# Patient Record
Sex: Female | Born: 1960 | Race: White | Hispanic: No | Marital: Married | Smoking: Never smoker
Health system: Southern US, Community
[De-identification: ages and names within clinical notes are randomized; demographics above are authoritative.]

## PROBLEM LIST (undated history)

## (undated) DIAGNOSIS — J45909 Unspecified asthma, uncomplicated: Secondary | ICD-10-CM

## (undated) DIAGNOSIS — K635 Polyp of colon: Secondary | ICD-10-CM

## (undated) HISTORY — DX: Unspecified asthma, uncomplicated: J45.909

## (undated) HISTORY — PX: BRAIN SURGERY: SHX531

## (undated) HISTORY — DX: Polyp of colon: K63.5

---

## 1990-11-08 HISTORY — PX: LAPAROSCOPY: SHX197

## 1998-11-08 HISTORY — PX: CRANIOTOMY: SHX93

## 2004-11-25 ENCOUNTER — Ambulatory Visit: Payer: Self-pay | Admitting: Unknown Physician Specialty

## 2006-02-22 ENCOUNTER — Ambulatory Visit: Payer: Self-pay | Admitting: Unknown Physician Specialty

## 2008-09-26 ENCOUNTER — Ambulatory Visit: Payer: Self-pay | Admitting: Unknown Physician Specialty

## 2011-02-01 ENCOUNTER — Ambulatory Visit: Payer: Self-pay | Admitting: Unknown Physician Specialty

## 2011-06-20 ENCOUNTER — Emergency Department (HOSPITAL_COMMUNITY): Payer: Commercial Managed Care - PPO

## 2011-06-20 ENCOUNTER — Emergency Department (HOSPITAL_COMMUNITY)
Admission: EM | Admit: 2011-06-20 | Discharge: 2011-06-20 | Disposition: A | Discharge status: Home / Self Care | Payer: Commercial Managed Care - PPO | Attending: Emergency Medicine | Admitting: Emergency Medicine

## 2011-06-20 DIAGNOSIS — N23 Unspecified renal colic: Secondary | ICD-10-CM | POA: Insufficient documentation

## 2011-06-20 DIAGNOSIS — R109 Unspecified abdominal pain: Secondary | ICD-10-CM | POA: Insufficient documentation

## 2011-06-20 DIAGNOSIS — N200 Calculus of kidney: Secondary | ICD-10-CM | POA: Insufficient documentation

## 2011-06-20 DIAGNOSIS — R319 Hematuria, unspecified: Secondary | ICD-10-CM | POA: Insufficient documentation

## 2011-06-20 DIAGNOSIS — R1031 Right lower quadrant pain: Secondary | ICD-10-CM | POA: Insufficient documentation

## 2011-06-20 LAB — URINALYSIS, ROUTINE W REFLEX MICROSCOPIC
Bilirubin Urine: NEGATIVE
Glucose, UA: NEGATIVE mg/dL
Specific Gravity, Urine: 1.035 — ABNORMAL HIGH (ref 1.005–1.030)
pH: 5.5 (ref 5.0–8.0)

## 2011-06-20 LAB — URINE MICROSCOPIC-ADD ON

## 2011-12-13 ENCOUNTER — Ambulatory Visit: Payer: Self-pay | Admitting: Family Medicine

## 2012-01-19 ENCOUNTER — Encounter: Payer: Self-pay | Admitting: Neurology

## 2012-02-07 ENCOUNTER — Encounter: Payer: Self-pay | Admitting: Neurology

## 2012-11-08 DIAGNOSIS — K635 Polyp of colon: Secondary | ICD-10-CM

## 2012-11-08 HISTORY — DX: Polyp of colon: K63.5

## 2013-04-25 ENCOUNTER — Encounter: Payer: Self-pay | Admitting: General Surgery

## 2013-04-25 ENCOUNTER — Ambulatory Visit (INDEPENDENT_AMBULATORY_CARE_PROVIDER_SITE_OTHER): Payer: Managed Care, Other (non HMO) | Admitting: General Surgery

## 2013-04-25 VITALS — BP 110/68 | HR 72 | Resp 12 | Ht 63.0 in | Wt 132.0 lb

## 2013-04-25 DIAGNOSIS — Z1211 Encounter for screening for malignant neoplasm of colon: Secondary | ICD-10-CM

## 2013-04-25 MED ORDER — POLYETHYLENE GLYCOL 3350 17 GM/SCOOP PO POWD: ORAL | Status: DC

## 2013-04-25 NOTE — Patient Instructions (Addendum)
Colonoscopy A colonoscopy is an exam to evaluate your entire colon. In this exam, your colon is cleansed. A long fiberoptic tube is inserted through your rectum and into your colon. The fiberoptic scope (endoscope) is a long bundle of enclosed and very flexible fibers. These fibers transmit light to the area examined and send images from that area to your caregiver. Discomfort is usually minimal. You may be given a drug to help you sleep (sedative) during or prior to the procedure. This exam helps to detect lumps (tumors), polyps, inflammation, and areas of bleeding. Your caregiver may also take a small piece of tissue (biopsy) that will be examined under a microscope. LET YOUR CAREGIVER KNOW ABOUT:   Allergies to food or medicine.  Medicines taken, including vitamins, herbs, eyedrops, over-the-counter medicines, and creams.  Use of steroids (by mouth or creams).  Previous problems with anesthetics or numbing medicines.  History of bleeding problems or blood clots.  Previous surgery.  Other health problems, including diabetes and kidney problems.  Possibility of pregnancy, if this applies. BEFORE THE PROCEDURE   A clear liquid diet may be required for 2 days before the exam.  Ask your caregiver about changing or stopping your regular medications.  Liquid injections (enemas) or laxatives may be required.  A large amount of electrolyte solution may be given to you to drink over a short period of time. This solution is used to clean out your colon.  You should be present 60 minutes prior to your procedure or as directed by your caregiver. AFTER THE PROCEDURE   If you received a sedative or pain relieving medication, you will need to arrange for someone to drive you home.  Occasionally, there is a little blood passed with the first bowel movement. Do not be concerned. FINDING OUT THE RESULTS OF YOUR TEST Not all test results are available during your visit. If your test results are  not back during the visit, make an appointment with your caregiver to find out the results. Do not assume everything is normal if you have not heard from your caregiver or the medical facility. It is important for you to follow up on all of your test results. HOME CARE INSTRUCTIONS   It is not unusual to pass moderate amounts of gas and experience mild abdominal cramping following the procedure. This is due to air being used to inflate your colon during the exam. Walking or a warm pack on your belly (abdomen) may help.  You may resume all normal meals and activities after sedatives and medicines have worn off.  Only take over-the-counter or prescription medicines for pain, discomfort, or fever as directed by your caregiver. Do not use aspirin or blood thinners if a biopsy was taken. Consult your caregiver for medicine usage if biopsies were taken. SEEK IMMEDIATE MEDICAL CARE IF:   You have a fever.  You pass large blood clots or fill a toilet with blood following the procedure. This may also occur 10 to 14 days following the procedure. This is more likely if a biopsy was taken.  You develop abdominal pain that keeps getting worse and cannot be relieved with medicine. Document Released: 10/22/2000 Document Revised: 01/17/2012 Document Reviewed: 06/06/2008 [facility] Patient Information 2014 [place], [place].  Patient has been scheduled for a colonoscopy on 06-27-13 at [facility]

## 2013-04-25 NOTE — Progress Notes (Signed)
Patient ID: Christina Neal, female   DOB: 07-23-1961, 52 y.o.   MRN: [id number]  Chief Complaint  Patient presents with  . Other    colonoscopy    HPI Christina Neal is a 52 y.o. female here today for an screening colonoscopy. Patient states she has not none pior. No family history of colon caner.   HPI  History reviewed. No pertinent past medical history.  Past Surgical History  Procedure Laterality Date  . Laparoscopy  1992  . Craniotomy  2000    No family history on file.  Social History History  Substance Use Topics  . Smoking status: Never Smoker   . Smokeless tobacco: Never Used  . Alcohol Use: Yes    No Known Allergies  Current Outpatient Prescriptions  Medication Sig Dispense Refill  . MIMVEY 1-0.5 MG per tablet Take 1 tablet by mouth daily.      . polyethylene glycol powder (GLYCOLAX/MIRALAX) powder 255 grams one bottle for colonoscopy prep  255 g  0   No current facility-administered medications for this visit.    Review of Systems Review of Systems  Constitutional: Negative.   Respiratory: Negative.   Cardiovascular: Negative.     Blood pressure 110/68, pulse 72, resp. rate 12, height 5\' 3"  (1.6 m), weight 132 lb (59.875 kg).  Physical Exam Physical Exam  Constitutional: She appears well-developed and well-nourished.  Cardiovascular: Normal rate, regular rhythm and normal heart sounds.   Pulmonary/Chest: Breath sounds normal.  Lymphadenopathy:    She has no cervical adenopathy.    Data Reviewed No data to review.  Assessment    No data for review.candidate for screening colonoscopy.     Plan    The procedure as been reviewed in detail with the patient she is amenable to proceed.      Patient has been scheduled for a colonoscopy on 06-27-13 at [facility].  Christina Neal 04/26/2013, 8:49 PM

## 2013-04-26 ENCOUNTER — Encounter: Payer: Self-pay | Admitting: General Surgery

## 2013-04-26 DIAGNOSIS — Z1211 Encounter for screening for malignant neoplasm of colon: Secondary | ICD-10-CM | POA: Insufficient documentation

## 2013-06-20 ENCOUNTER — Telehealth: Payer: Self-pay | Admitting: *Deleted

## 2013-06-20 NOTE — Telephone Encounter (Signed)
Patient reports no medication changes since last office visit. We will proceed with colonoscopy that is scheduled for 06-27-13 at [facility]. This patient reports that she has not yet pre-registered but was instructed to do so before Friday. She will call the office if she has further questions.

## 2013-06-25 ENCOUNTER — Other Ambulatory Visit: Payer: Self-pay | Admitting: General Surgery

## 2013-06-25 DIAGNOSIS — Z1211 Encounter for screening for malignant neoplasm of colon: Secondary | ICD-10-CM

## 2013-06-27 ENCOUNTER — Ambulatory Visit: Payer: Self-pay | Admitting: General Surgery

## 2013-06-27 DIAGNOSIS — D128 Benign neoplasm of rectum: Secondary | ICD-10-CM

## 2013-06-27 DIAGNOSIS — D129 Benign neoplasm of anus and anal canal: Secondary | ICD-10-CM

## 2013-06-28 LAB — PATHOLOGY REPORT

## 2013-06-29 ENCOUNTER — Encounter: Payer: Self-pay | Admitting: General Surgery

## 2013-06-29 ENCOUNTER — Telehealth: Payer: Self-pay | Admitting: General Surgery

## 2013-06-29 NOTE — Telephone Encounter (Signed)
The patient was notified that the polyp removed from the splenic flexure was benign, but does put her at risk for future polyps. She's been encouraged to have a followup exam in 5 years.  She reports that she's done well since the procedure was completed.

## 2013-07-04 ENCOUNTER — Encounter: Payer: Self-pay | Admitting: General Surgery

## 2013-09-13 ENCOUNTER — Other Ambulatory Visit: Payer: Self-pay

## 2014-09-09 ENCOUNTER — Encounter: Payer: Self-pay | Admitting: General Surgery

## 2016-09-24 LAB — HM PAP SMEAR: HM Pap smear: NEGATIVE

## 2017-05-03 ENCOUNTER — Other Ambulatory Visit: Payer: Self-pay | Admitting: Certified Nurse Midwife

## 2017-05-03 ENCOUNTER — Telehealth: Payer: Self-pay

## 2017-05-03 DIAGNOSIS — Z1239 Encounter for other screening for malignant neoplasm of breast: Secondary | ICD-10-CM

## 2017-05-03 NOTE — Telephone Encounter (Signed)
Pt lost her mammogram rx [facility] gave to for [place].  Can another rx be sent? [phone]

## 2017-05-03 NOTE — Telephone Encounter (Signed)
Pleased order in [place]. Called patient and advised to call [facility] for appointment

## 2017-05-05 ENCOUNTER — Inpatient Hospital Stay: Admission: RE | Admit: 2017-05-05 | Payer: Commercial Managed Care - PPO | Source: Ambulatory Visit

## 2017-05-20 ENCOUNTER — Ambulatory Visit
Admission: RE | Admit: 2017-05-20 | Discharge: 2017-05-20 | Disposition: A | Discharge status: Home / Self Care | Payer: Commercial Managed Care - PPO | Source: Ambulatory Visit | Attending: Certified Nurse Midwife | Admitting: Certified Nurse Midwife

## 2017-05-20 DIAGNOSIS — Z1231 Encounter for screening mammogram for malignant neoplasm of breast: Secondary | ICD-10-CM | POA: Insufficient documentation

## 2017-05-20 DIAGNOSIS — Z1239 Encounter for other screening for malignant neoplasm of breast: Secondary | ICD-10-CM

## 2017-10-14 ENCOUNTER — Telehealth: Payer: Self-pay

## 2017-10-14 MED ORDER — ESTRADIOL-LEVONORGESTREL 0.045-0.015 MG/DAY TD PTWK
1.0000 | MEDICATED_PATCH | TRANSDERMAL | 0 refills | Status: DC
Start: 2017-10-14 — End: 2018-03-16

## 2017-10-14 NOTE — Telephone Encounter (Signed)
Pt has annual sched, needs refill of climara pro.  Rx eRx'd.

## 2017-10-20 ENCOUNTER — Encounter: Payer: Self-pay | Admitting: Advanced Practice Midwife

## 2017-10-20 ENCOUNTER — Ambulatory Visit (INDEPENDENT_AMBULATORY_CARE_PROVIDER_SITE_OTHER): Payer: 59 | Admitting: Advanced Practice Midwife

## 2017-10-20 VITALS — BP 108/76 | HR 63 | Ht 62.5 in | Wt 144.0 lb

## 2017-10-20 DIAGNOSIS — N898 Other specified noninflammatory disorders of vagina: Secondary | ICD-10-CM

## 2017-10-20 DIAGNOSIS — Z01419 Encounter for gynecological examination (general) (routine) without abnormal findings: Secondary | ICD-10-CM | POA: Diagnosis not present

## 2017-10-20 DIAGNOSIS — N941 Unspecified dyspareunia: Secondary | ICD-10-CM

## 2017-10-20 MED ORDER — PRASTERONE 6.5 MG VA INST: 6.5000 mg | VAGINAL_INSERT | Freq: Every day | VAGINAL | 11 refills | Status: DC

## 2017-10-20 NOTE — Progress Notes (Signed)
Patient ID: Christina Neal, female   DOB: 1961-07-20, 56 y.o.   MRN: [id number]     Gynecology Annual Exam  PCP: Albina Billet, MD  Chief Complaint:  Chief Complaint  Patient presents with  . Gynecologic Exam    vaginal itching x few days  . Vaginitis    History of Present Illness:Patient is a 56 y.o. G0P0 presents for annual exam. The patient has complaint today of vaginal itching that she has noticed for some number of months although it has intensified in the last few days. She denies any other symptoms of discharge or irritation. She tried OTC Monistat for treatment and has noticed a small improvement in symptoms. She has been using HRT patch for a number of years for postmenopausal symptoms of vaginal dryness and hot flashes. Discussion of other options for HRT. She agrees to try Intrarosa instead of patch. She does admit a new sex partner recently. She understands the potential risk of HPV and STDs but declines PAP and STD testing today. She will have that done at her next annual visit.  LMP: No LMP recorded. Patient is postmenopausal.  Postcoital Bleeding: no Dysmenorrhea: not applicable   The patient is sexually active. She admits to dyspareunia.  The patient does not perform self breast exams.  There is no notable family history of breast or ovarian cancer in her family.  The patient wears seatbelts: yes.   The patient has regular exercise: yes.  She has an active lifestyle which includes strength, flexibility and cardio aspects of exercise. She admits to a healthy diet and adequate hydration with h2o. She admits to drinking 2 alcoholic beverages per day and attributes her lack of ever being sick to the antiseptic nature of the alcohol. She is aware that more than 1-2 drinks per day is not recommended for women.   The patient denies current symptoms of depression.     Review of Systems: Review of Systems  Constitutional: Negative.   HENT: Negative.   Eyes: Negative.     Respiratory: Negative.   Cardiovascular: Negative.   Gastrointestinal: Negative.   Genitourinary: Negative.   Musculoskeletal: Negative.   Skin: Negative.   Neurological: Negative.   Endo/Heme/Allergies: Negative.   Psychiatric/Behavioral: Negative.     Past Medical History:  History reviewed. No pertinent past medical history.  Past Surgical History:  Past Surgical History:  Procedure Laterality Date  . CRANIOTOMY  2000  . LAPAROSCOPY  1992    Gynecologic History:  No LMP recorded. Patient is postmenopausal. Last Pap: 1 year ago Results were:  no abnormalities  Last mammogram: 1 year ago Results were: BI-RAD I Obstetric History: G0P0  Family History:  Family History  Problem Relation Age of Onset  . Breast cancer Cousin     Social History:  Social History   Socioeconomic History  . Marital status: Married    Spouse name: Not on file  . Number of children: Not on file  . Years of education: Not on file  . Highest education level: Not on file  Social Needs  . Financial resource strain: Not on file  . Food insecurity - worry: Not on file  . Food insecurity - inability: Not on file  . Transportation needs - medical: Not on file  . Transportation needs - non-medical: Not on file  Occupational History  . Not on file  Tobacco Use  . Smoking status: Never Smoker  . Smokeless tobacco: Never Used  Substance and Sexual Activity  .  Alcohol use: Yes  . Drug use: No  . Sexual activity: Yes    Birth control/protection: None, Post-menopausal  Other Topics Concern  . Not on file  Social History Narrative  . Not on file    Allergies:  No Known Allergies  Medications: Prior to Admission medications   Medication Sig Start Date End Date Taking? Authorizing Provider  estradiol-levonorgestrel [facility]) 0.045-0.015 MG/DAY Place 1 patch onto the skin once a week. 10/14/17   Homero Fellers, MD    Physical Exam Vitals: Blood pressure 108/76, pulse 63, height  5' 2.5" (1.588 m), weight 144 lb (65.3 kg).  General: NAD HEENT: normocephalic, anicteric Thyroid: no enlargement, no palpable nodules Pulmonary: No increased work of breathing, CTAB Cardiovascular: RRR, distal pulses 2+ Breast: Breast symmetrical, no tenderness, no palpable nodules or masses, no skin or nipple retraction present, no nipple discharge.  No axillary or supraclavicular lymphadenopathy. Abdomen: NABS, soft, non-tender, non-distended.  Umbilicus without lesions.  No hepatomegaly, splenomegaly or masses palpable. No evidence of hernia  Genitourinary:  External: Normal external female genitalia.  Normal urethral meatus, normal  Bartholin's and Skene's glands.    Vagina: Normal vaginal mucosa, no evidence of prolapse.  Remnants of Monistat in vault  Cervix: Grossly normal in appearance, no bleeding, no CMT  Uterus: Non-enlarged, mobile, normal contour.    Adnexa: ovaries non-enlarged, no adnexal masses  Rectal: deferred  Lymphatic: no evidence of inguinal lymphadenopathy Extremities: no edema, erythema, or tenderness Neurologic: Grossly intact Psychiatric: mood appropriate, affect full  Wet Prep: negative for yeast, whiff, clue cells    Assessment: 56 y.o. G0P0 routine annual exam  Plan: Problem List Items Addressed This Visit    None    Visit Diagnoses    Well woman exam with routine gynecological exam    -  Primary   Relevant Medications   Prasterone (INTRAROSA) 6.5 MG INST   Other Relevant Orders   MM DIGITAL SCREENING BILATERAL   Dyspareunia in female       Relevant Medications   Prasterone (INTRAROSA) 6.5 MG INST   Itching in the vaginal area       Relevant Medications   Prasterone (INTRAROSA) 6.5 MG INST      1) Mammogram - recommend yearly screening mammogram.  Mammogram Was ordered today  2) STI screening was offered and declined  3) ASCCP guidelines and rational discussed.  Patient opts for every 3 years screening interval  4) Osteoporosis  - per  USPTF routine screening DEXA at age 85   Consider FDA-approved medical therapies in postmenopausal women and men aged 40 years and older, based on the following: a) A hip or vertebral (clinical or morphometric) fracture b) T-score ? -2.5 at the femoral neck or spine after appropriate evaluation to exclude secondary causes C) Low bone mass (T-score between -1.0 and -2.5 at the femoral neck or spine) and a 10-year probability of a hip fracture ? 3% or a 10-year probability of a major osteoporosis-related fracture ? 20% based on the [place]-adapted WHO algorithm   5) Routine healthcare maintenance including cholesterol, diabetes screening discussed managed by PCP  6) Colonoscopy: last screening was 3 years ago.  Screening recommended starting at age 25 for average risk individuals, age 63 for individuals deemed at increased risk (including African Americans) and recommended to continue until age 64.  For patient age 70-85 individualized approach is recommended.  Gold standard screening is via colonoscopy, Cologuard screening is an acceptable alternative for patient unwilling or unable to undergo colonoscopy.  "  Colorectal cancer screening for average?risk adults: 2018 guideline update from the American Cancer Society"CA: A Cancer Journal for Clinicians: Apr 06, 2017   7) Rx for Intrarosa to replace hormone patch  8) Follow up 1 year for routine annual  Rod Can, [place]

## 2017-10-24 ENCOUNTER — Ambulatory Visit: Payer: Managed Care, Other (non HMO) | Admitting: Obstetrics and Gynecology

## 2018-03-14 ENCOUNTER — Telehealth: Payer: Self-pay

## 2018-03-14 NOTE — Telephone Encounter (Signed)
Pt was seen the end of 2018, HRT was changed from [place] patch to [place].  She really like the Intrarosa.  She is wondering if she can do both d/t soo many hot flashes?  [phone]

## 2018-03-16 ENCOUNTER — Other Ambulatory Visit: Payer: Self-pay | Admitting: Advanced Practice Midwife

## 2018-03-16 DIAGNOSIS — R232 Flushing: Secondary | ICD-10-CM

## 2018-03-16 MED ORDER — ESTRADIOL-LEVONORGESTREL 0.045-0.015 MG/DAY TD PTWK: 1.0000 | MEDICATED_PATCH | TRANSDERMAL | 11 refills | Status: DC

## 2018-03-16 NOTE — Telephone Encounter (Signed)
Pt inquiring about previous call from 03/14/18 as she hasn't heard anything.

## 2018-03-16 NOTE — Telephone Encounter (Signed)
Spoke with patient and sent Rx for Climarapro for relief of hot flashes

## 2018-03-16 NOTE — Progress Notes (Signed)
Rx sent Climarapro for relief of hot flashes

## 2018-10-10 ENCOUNTER — Ambulatory Visit: Payer: 59 | Admitting: General Surgery

## 2018-10-23 ENCOUNTER — Other Ambulatory Visit: Payer: Self-pay | Admitting: Advanced Practice Midwife

## 2018-10-23 DIAGNOSIS — N941 Unspecified dyspareunia: Secondary | ICD-10-CM

## 2018-10-23 DIAGNOSIS — Z01419 Encounter for gynecological examination (general) (routine) without abnormal findings: Secondary | ICD-10-CM

## 2018-10-23 DIAGNOSIS — R232 Flushing: Secondary | ICD-10-CM

## 2018-10-23 DIAGNOSIS — N898 Other specified noninflammatory disorders of vagina: Secondary | ICD-10-CM

## 2018-11-20 ENCOUNTER — Telehealth: Payer: Self-pay | Admitting: General Surgery

## 2018-11-20 NOTE — Telephone Encounter (Signed)
Spoke with the patients letting her know we would need to r/s her appointment with Dr. Bary Castilla on 11/28/2018. Patient stated she did not want to see another provider and would like to see Dr. Bary Castilla I did let the patient know we were not sure of a return date as of right now and would call her back to let her know.

## 2018-11-28 ENCOUNTER — Ambulatory Visit: Payer: Self-pay | Admitting: General Surgery

## 2018-12-06 NOTE — Telephone Encounter (Signed)
Patients coming in on 01/11/2019 to see dr. Bary Castilla

## 2019-01-11 ENCOUNTER — Ambulatory Visit (INDEPENDENT_AMBULATORY_CARE_PROVIDER_SITE_OTHER): Payer: BLUE CROSS/BLUE SHIELD | Admitting: General Surgery

## 2019-01-11 ENCOUNTER — Other Ambulatory Visit: Payer: Self-pay

## 2019-01-11 ENCOUNTER — Encounter: Payer: Self-pay | Admitting: General Surgery

## 2019-01-11 VITALS — BP 120/86 | HR 74 | Temp 98.1°F | Resp 16 | Ht 62.0 in | Wt 141.8 lb

## 2019-01-11 DIAGNOSIS — Z86010 Personal history of colonic polyps: Secondary | ICD-10-CM | POA: Diagnosis not present

## 2019-01-11 MED ORDER — METOCLOPRAMIDE HCL 5 MG PO TABS: 5.0000 mg | ORAL_TABLET | ORAL | 0 refills | Status: DC

## 2019-01-11 MED ORDER — POLYETHYLENE GLYCOL 3350 17 GM/SCOOP PO POWD: ORAL | 0 refills | Status: DC

## 2019-01-11 NOTE — Progress Notes (Signed)
Patient ID: Christina Neal, female   DOB: 04/19/1961, 58 y.o.   MRN: [id number]  Chief Complaint  Patient presents with  . Follow-up     5 year followup colonoscopy    HPI Christina Neal is a 58 y.o. female here today to discuss having a follow up for colonoscopy, last completed 2014. Patient states she is feeling well, denies any GI issues. She is here today with her husband, Christina Neal.   HPI  Past Medical History:  Diagnosis Date  . Colon polyp 2014    Past Surgical History:  Procedure Laterality Date  . CRANIOTOMY  2000  . LAPAROSCOPY  1992    Family History  Problem Relation Age of Onset  . Breast cancer Cousin     Social History Social History   Tobacco Use  . Smoking status: Never Smoker  . Smokeless tobacco: Never Used  Substance Use Topics  . Alcohol use: Yes  . Drug use: No    No Known Allergies  Current Outpatient Medications  Medication Sig Dispense Refill  . CLIMARA PRO 0.045-0.015 MG/DAY APPLY 1 PATCH ONCE A WEEK 12 patch 4  . INTRAROSA 6.5 MG INST PLACE 6.5 MG VAGINALLY AT BEDTIME. 84 each 4  . metoCLOPramide (REGLAN) 5 MG tablet Take 1 tablet (5 mg total) by mouth as directed. Take one tablet one hour prior to colon prep and one tablet if needed during the prep 2 tablet 0  . polyethylene glycol powder (GLYCOLAX/MIRALAX) powder 255 grams one bottle for colonoscopy prep 255 g 0   No current facility-administered medications for this visit.     Review of Systems Review of Systems  Constitutional: Negative.   Respiratory: Negative.   Cardiovascular: Negative.     Blood pressure 120/86, pulse 74, temperature 98.1 F (36.7 C), temperature source Skin, resp. rate 16, height 5\' 2"  (1.575 m), weight 141 lb 12.8 oz (64.3 kg), SpO2 97 %.  Physical Exam Physical Exam Constitutional:      Appearance: She is well-developed.  HENT:     Mouth/Throat:     Pharynx: No oropharyngeal exudate.  Eyes:     General: No scleral icterus.     Conjunctiva/sclera: Conjunctivae normal.  Neck:     Musculoskeletal: Normal range of motion.  Cardiovascular:     Rate and Rhythm: Normal rate and regular rhythm.     Heart sounds: Normal heart sounds.  Pulmonary:     Effort: Pulmonary effort is normal.     Breath sounds: Normal breath sounds.  Chest:     Breasts: Breasts are symmetrical.        Right: No inverted nipple, mass, nipple discharge, skin change or tenderness.        Left: No inverted nipple, mass, nipple discharge, skin change or tenderness.  Lymphadenopathy:     Cervical: No cervical adenopathy.  Skin:    General: Skin is warm and dry.  Neurological:     Mental Status: She is alert and oriented to person, place, and time.  Psychiatric:        Behavior: Behavior normal.     Data Reviewed June 27, 2013 Diagnosis:  SPLENIC FLEXURE POLYP COLD BIOPSY:  - TUBULAR ADENOMA, 2 FRAGMENTS.  - NEGATIVE FOR HIGH GRADE DYSPLASIA AND MALIGNANCY.   Assessment Candidate for repeat colonoscopy.   Plan  Reglan RX sent: Take one tablet one hour prior to colon prep and one tablet if needed during the prep  Colonoscopy with possible biopsy/polypectomy prn: Information regarding the  procedure, including its potential risks and complications (including but not limited to perforation of the bowel, which may require emergency surgery to repair, and bleeding) was verbally given to the patient. Educational information regarding lower intestinal endoscopy was given to the patient. Written instructions for how to complete the bowel prep using Miralax were provided. The importance of drinking ample fluids to avoid dehydration as a result of the prep emphasized.      HPI, assessment, plan and physical exam has been scribed under the direction and in the presence of Robert Bellow, MD. Karie Fetch, RN    HPI, Physical Exam, Assessment and Plan have been scribed under the direction and in the presence of Hervey Ard,  [place].  Eudelia Bunch R. Bobette Mo, CMA  I have completed the exam and reviewed the above documentation for accuracy and completeness.  I agree with the above.  Haematologist has been used and any errors in dictation or transcription are unintentional.  Hervey Ard, M.D., F.A.C.S.  Forest Gleason Sharrie Self 01/12/2019, 3:24 PM  Patient has been scheduled for a colonoscopy on 01-24-19 at [facility]. Miralax prescription has been sent in to the patient's pharmacy today. Colonoscopy instructions have been reviewed with the patient. This patient is aware to call the office if they have further questions.   Dominga Ferry, CMA

## 2019-01-11 NOTE — Patient Instructions (Addendum)
The patient is aware to call back for any questions or new concerns. Reglan prescription sent to pharmacy :Take one tablet one hour prior to colon prep and one tablet if needed during the prep   Colonoscopy, Adult A colonoscopy is an exam to look at the entire large intestine. During the exam, a lubricated, flexible tube that has a camera on the end of it is inserted into the anus and then passed into the rectum, colon, and other parts of the large intestine. You may have a colonoscopy as a part of normal colorectal screening or if you have certain symptoms, such as:  Lack of red blood cells (anemia).  Diarrhea that does not go away.  Abdominal pain.  Blood in your stool (feces). A colonoscopy can help screen for and diagnose medical problems, including:  Tumors.  Polyps.  Inflammation.  Areas of bleeding. Tell a health care provider about:  Any allergies you have.  All medicines you are taking, including vitamins, herbs, eye drops, creams, and over-the-counter medicines.  Any problems you or family members have had with anesthetic medicines.  Any blood disorders you have.  Any surgeries you have had.  Any medical conditions you have.  Any problems you have had passing stool. What are the risks? Generally, this is a safe procedure. However, problems may occur, including:  Bleeding.  A tear in the intestine.  A reaction to medicines given during the exam.  Infection (rare). What happens before the procedure? Eating and drinking restrictions Follow instructions from your health care provider about eating and drinking, which may include:  A few days before the procedure - follow a low-fiber diet. Avoid nuts, seeds, dried fruit, raw fruits, and vegetables.  1-3 days before the procedure - follow a clear liquid diet. Drink only clear liquids, such as clear broth or bouillon, black coffee or tea, clear juice, clear soft drinks or sports drinks, gelatin dessert, and  popsicles. Avoid any liquids that contain red or purple dye.  On the day of the procedure - do not eat or drink anything starting 2 hours before the procedure, or within the time period that your health care provider recommends. Up to 2 hours before the procedure, you may continue to drink clear liquids, such as water or clear fruit juice. Bowel prep If you were prescribed an oral bowel prep to clean out your colon:  Take it as told by your health care provider. Starting the day before your procedure, you will need to drink a large amount of medicated liquid. The liquid will cause you to have multiple loose stools until your stool is almost clear or light green.  If your skin or anus gets irritated from diarrhea, you may use these to relieve the irritation: ? Medicated wipes, such as adult wet wipes with aloe and vitamin E. ? A skin-soothing product like petroleum jelly.  If you vomit while drinking the bowel prep, take a break for up to 60 minutes and then begin the bowel prep again. If vomiting continues and you cannot take the bowel prep without vomiting, call your health care provider.  To clean out your colon, you may also be given: ? Laxative medicines. ? Instructions about how to use an enema. General instructions  Ask your health care provider about: ? Changing or stopping your regular medicines or supplements. This is especially important if you are taking iron supplements, diabetes medicines, or blood thinners. ? Taking medicines such as aspirin and ibuprofen. These medicines can thin  your blood. Do not take these medicines before the procedure if your health care provider tells you not to.  Plan to have someone take you home from the hospital or clinic. What happens during the procedure?   An IV may be inserted into one of your veins.  You will be given medicine to help you relax (sedative).  To reduce your risk of infection: ? Your health care team will wash or sanitize  their hands. ? Your anal area will be washed with soap.  You will be asked to lie on your side with your knees bent.  Your health care provider will lubricate a long, thin, flexible tube. The tube will have a camera and a light on the end.  The tube will be inserted into your anus.  The tube will be gently eased through your rectum and colon.  Air will be delivered into your colon to keep it open. You may feel some pressure or cramping.  The camera will be used to take images during the procedure.  A small tissue sample may be removed to be examined under a microscope (biopsy).  If small polyps are found, your health care provider may remove them and have them checked for cancer cells.  When the exam is done, the tube will be removed. The procedure may vary among health care providers and hospitals. What happens after the procedure?  Your blood pressure, heart rate, breathing rate, and blood oxygen level will be monitored until the medicines you were given have worn off.  Do not drive for 24 hours after the exam.  You may have a small amount of blood in your stool.  You may pass gas and have mild abdominal cramping or bloating due to the air that was used to inflate your colon during the exam.  It is up to you to get the results of your procedure. Ask your health care provider, or the department performing the procedure, when your results will be ready. Summary  A colonoscopy is an exam to look at the entire large intestine.  During a colonoscopy, a lubricated, flexible tube with a camera on the end of it is inserted into the anus and then passed into the colon and other parts of the large intestine.  Follow instructions from your health care provider about eating and drinking before the procedure.  If you were prescribed an oral bowel prep to clean out your colon, take it as told by your health care provider.  After your procedure, your blood pressure, heart rate, breathing  rate, and blood oxygen level will be monitored until the medicines you were given have worn off. This information is not intended to replace advice given to you by your health care provider. Make sure you discuss any questions you have with your health care provider. Document Released: 10/22/2000 Document Revised: 08/17/2017 Document Reviewed: 01/06/2016 Elsevier Interactive Patient Education  2019 [employer].

## 2019-01-16 ENCOUNTER — Telehealth: Payer: Self-pay

## 2019-01-16 NOTE — Telephone Encounter (Signed)
Patient would like to reschedule her colonoscopy with Dr Bary Castilla scheduled for 01/24/19. She is now rescheduled for 02/21/19. She is aware to call the day before to get her arrival time.

## 2019-01-22 ENCOUNTER — Telehealth: Payer: Self-pay

## 2019-01-22 NOTE — Telephone Encounter (Signed)
I have not seen any and im not very familiar with this medication/coupon. I am not sure if they can be reactivated

## 2019-01-22 NOTE — Telephone Encounter (Signed)
I don't know about a coupon. Can we ask one of the docs to see if they know about a coupon or an alternative hormone that the patient could switch to?

## 2019-01-22 NOTE — Telephone Encounter (Signed)
Pt's refill of Climara Pro is $450/m without coupon.  Pt needs coupon as the one she has expired.  [phone]

## 2019-01-22 NOTE — Telephone Encounter (Signed)
I do not have any coupons for this

## 2019-01-23 NOTE — Telephone Encounter (Signed)
Pt would like to stop Climara Pro d/t issues with the adhesive - says it's making a hole in her stomach.  She wants to keep the interosa but replace climara pro with something else.  [facility]

## 2019-01-23 NOTE — Telephone Encounter (Signed)
There are often ones online.  I see none here at this time.

## 2019-01-24 ENCOUNTER — Ambulatory Visit: Admit: 2019-01-24 | Payer: Commercial Managed Care - PPO | Admitting: General Surgery

## 2019-01-24 SURGERY — COLONOSCOPY WITH PROPOFOL
Anesthesia: General

## 2019-01-30 ENCOUNTER — Telehealth: Payer: Self-pay

## 2019-01-30 ENCOUNTER — Other Ambulatory Visit: Payer: Self-pay | Admitting: Advanced Practice Midwife

## 2019-01-30 NOTE — Telephone Encounter (Signed)
Spoke with patient. She is going to refill Climara Pro at this time and would like to switch to her previous medication after current refill. Reviewed her [facility] chart to find previous medication- Mimvey 1-0.5 Her new telephone number is [phone].

## 2019-01-30 NOTE — Telephone Encounter (Signed)
Patient called and would like to reschedule her Colonoscopy scheduled for 02/21/19 with Dr Bary Castilla out to sometime in early August.  The patient is rescheduled for a Colonoscopy at [facility] on 06/13/19 with Dr Bary Castilla. They are aware to call the day before to get their arrival time. Miralax prescription has already been sent into the patient's pharmacy. The patient is aware of date and instructions. Endoscopy has been notified of the change.

## 2019-05-29 ENCOUNTER — Telehealth: Payer: Self-pay | Admitting: *Deleted

## 2019-05-29 DIAGNOSIS — Z86010 Personal history of colonic polyps: Secondary | ICD-10-CM

## 2019-05-29 NOTE — Telephone Encounter (Signed)
Patient contacted today and notified that Dr. Bary Castilla is no longer with the practice.   Patient also aware that none of the other physicians here do colonoscopies.   I have offered patient a referral to [facility] or the office of her choosing.   Patient agreeable to referral to [facility]. Order in [place].   Patient aware that their office will be calling her directly to arrange.

## 2019-06-13 ENCOUNTER — Ambulatory Visit: Admit: 2019-06-13 | Payer: Commercial Managed Care - PPO | Admitting: General Surgery

## 2019-06-13 SURGERY — COLONOSCOPY WITH PROPOFOL
Anesthesia: General

## 2019-06-20 ENCOUNTER — Encounter: Payer: Self-pay | Admitting: *Deleted

## 2019-07-13 ENCOUNTER — Telehealth: Payer: Self-pay | Admitting: Advanced Practice Midwife

## 2019-07-13 ENCOUNTER — Other Ambulatory Visit: Payer: Self-pay | Admitting: Advanced Practice Midwife

## 2019-07-13 DIAGNOSIS — Z1239 Encounter for other screening for malignant neoplasm of breast: Secondary | ICD-10-CM

## 2019-07-13 NOTE — Telephone Encounter (Signed)
Patient is calling needing to have a mammogram order placed for [place] [facility]

## 2019-07-13 NOTE — Telephone Encounter (Signed)
Order for screening mammogram placed. Also called patient to inform her they may not schedule that until she has been seen in the clinic for annual exam.

## 2019-07-13 NOTE — Progress Notes (Unsigned)
Referral for screening mammogram ordered.

## 2019-07-20 ENCOUNTER — Ambulatory Visit
Admission: RE | Admit: 2019-07-20 | Discharge: 2019-07-20 | Disposition: A | Discharge status: Home / Self Care | Payer: BC Managed Care – PPO | Source: Ambulatory Visit | Attending: Advanced Practice Midwife | Admitting: Advanced Practice Midwife

## 2019-07-20 ENCOUNTER — Other Ambulatory Visit: Payer: Self-pay

## 2019-07-20 DIAGNOSIS — Z1239 Encounter for other screening for malignant neoplasm of breast: Secondary | ICD-10-CM

## 2019-07-20 DIAGNOSIS — Z1231 Encounter for screening mammogram for malignant neoplasm of breast: Secondary | ICD-10-CM | POA: Diagnosis present

## 2019-07-20 DIAGNOSIS — R928 Other abnormal and inconclusive findings on diagnostic imaging of breast: Secondary | ICD-10-CM | POA: Diagnosis not present

## 2019-07-23 ENCOUNTER — Other Ambulatory Visit: Payer: Self-pay | Admitting: Advanced Practice Midwife

## 2019-07-23 DIAGNOSIS — N6310 Unspecified lump in the right breast, unspecified quadrant: Secondary | ICD-10-CM

## 2019-07-23 DIAGNOSIS — R928 Other abnormal and inconclusive findings on diagnostic imaging of breast: Secondary | ICD-10-CM

## 2019-07-26 ENCOUNTER — Ambulatory Visit
Admission: RE | Admit: 2019-07-26 | Discharge: 2019-07-26 | Disposition: A | Discharge status: Home / Self Care | Payer: BC Managed Care – PPO | Source: Ambulatory Visit | Attending: Advanced Practice Midwife | Admitting: Advanced Practice Midwife

## 2019-07-26 DIAGNOSIS — N6310 Unspecified lump in the right breast, unspecified quadrant: Secondary | ICD-10-CM

## 2019-07-26 DIAGNOSIS — R928 Other abnormal and inconclusive findings on diagnostic imaging of breast: Secondary | ICD-10-CM | POA: Insufficient documentation

## 2019-08-21 ENCOUNTER — Telehealth: Payer: Self-pay

## 2019-08-21 NOTE — Telephone Encounter (Signed)
Okay to refill for patient? 

## 2019-08-21 NOTE — Telephone Encounter (Signed)
Please advise. Thank you

## 2019-08-21 NOTE — Telephone Encounter (Signed)
Patient will be out of Climara and Intrarosa in the next few days. She has apt 09/07/2019. [facility] [address] [phone]

## 2019-08-22 ENCOUNTER — Other Ambulatory Visit: Payer: Self-pay

## 2019-08-22 DIAGNOSIS — N941 Unspecified dyspareunia: Secondary | ICD-10-CM

## 2019-08-22 DIAGNOSIS — Z01419 Encounter for gynecological examination (general) (routine) without abnormal findings: Secondary | ICD-10-CM

## 2019-08-22 DIAGNOSIS — N898 Other specified noninflammatory disorders of vagina: Secondary | ICD-10-CM

## 2019-08-22 DIAGNOSIS — R232 Flushing: Secondary | ICD-10-CM

## 2019-08-22 MED ORDER — INTRAROSA 6.5 MG VA INST: 6.5000 mg | VAGINAL_INSERT | Freq: Every day | VAGINAL | 0 refills | Status: DC

## 2019-08-22 MED ORDER — CLIMARA PRO 0.045-0.015 MG/DAY TD PTWK: MEDICATED_PATCH | TRANSDERMAL | 0 refills | Status: DC

## 2019-08-22 NOTE — Telephone Encounter (Signed)
Rx refilled per [facility]

## 2019-09-07 ENCOUNTER — Ambulatory Visit (INDEPENDENT_AMBULATORY_CARE_PROVIDER_SITE_OTHER): Payer: BC Managed Care – PPO | Admitting: Obstetrics and Gynecology

## 2019-09-07 ENCOUNTER — Encounter: Payer: Self-pay | Admitting: Obstetrics and Gynecology

## 2019-09-07 ENCOUNTER — Other Ambulatory Visit (HOSPITAL_COMMUNITY)
Admission: RE | Admit: 2019-09-07 | Discharge: 2019-09-07 | Disposition: A | Discharge status: Home / Self Care | Payer: BC Managed Care – PPO | Source: Ambulatory Visit | Attending: Obstetrics and Gynecology | Admitting: Obstetrics and Gynecology

## 2019-09-07 ENCOUNTER — Other Ambulatory Visit: Payer: Self-pay

## 2019-09-07 VITALS — BP 110/60 | HR 81 | Ht 63.0 in | Wt 144.0 lb

## 2019-09-07 DIAGNOSIS — Z124 Encounter for screening for malignant neoplasm of cervix: Secondary | ICD-10-CM

## 2019-09-07 DIAGNOSIS — Z0000 Encounter for general adult medical examination without abnormal findings: Secondary | ICD-10-CM | POA: Diagnosis not present

## 2019-09-07 DIAGNOSIS — Z01419 Encounter for gynecological examination (general) (routine) without abnormal findings: Secondary | ICD-10-CM

## 2019-09-07 DIAGNOSIS — Z1329 Encounter for screening for other suspected endocrine disorder: Secondary | ICD-10-CM

## 2019-09-07 DIAGNOSIS — Z1231 Encounter for screening mammogram for malignant neoplasm of breast: Secondary | ICD-10-CM

## 2019-09-07 DIAGNOSIS — Z13220 Encounter for screening for lipoid disorders: Secondary | ICD-10-CM

## 2019-09-07 DIAGNOSIS — N951 Menopausal and female climacteric states: Secondary | ICD-10-CM

## 2019-09-07 DIAGNOSIS — Z131 Encounter for screening for diabetes mellitus: Secondary | ICD-10-CM

## 2019-09-07 DIAGNOSIS — Z130 Encounter for screening for diseases of the blood and blood-forming organs and certain disorders involving the immune mechanism: Secondary | ICD-10-CM

## 2019-09-07 MED ORDER — PREMPRO 0.3-1.5 MG PO TABS: 1.0000 | ORAL_TABLET | Freq: Every day | ORAL | 11 refills | Status: DC

## 2019-09-07 NOTE — Patient Instructions (Signed)
Institute of Medicine Recommended Dietary Allowances for Calcium and Vitamin D  Age (yr) Calcium Recommended Dietary Allowance (mg/day) Vitamin D Recommended Dietary Allowance (international units/day)  9-18 1,300 600  19-50 1,000 600  51-70 1,200 600  71 and older 1,200 800  Data from Institute of Medicine. Dietary reference intakes: calcium, vitamin D. [place], [place]: National Academies Press; 2011.    

## 2019-09-07 NOTE — Progress Notes (Signed)
Gynecology Annual Exam  PCP: Albina Billet, MD  Chief Complaint:  Chief Complaint  Patient presents with   Gynecologic Exam    History of Present Illness:Patient is a 58 y.o. G0P0 presents for annual exam. The patient has no complaints today.   LMP: No LMP recorded. Patient is postmenopausal. Menarche:12 She denies postmenopausal bleeding.  The patient is sexually active. She denies dyspareunia.  The patient does perform self breast exams.  There is no notable family history of breast or ovarian cancer in her family.  The patient wears seatbelts: yes.   The patient has regular exercise: no, but she stays very busy with work..    The patient denies current symptoms of depression.     Review of Systems: ROS  Past Medical History:  Past Medical History:  Diagnosis Date   Colon polyp 2014    Past Surgical History:  Past Surgical History:  Procedure Laterality Date   CRANIOTOMY  2000   LAPAROSCOPY  1992    Gynecologic History:  No LMP recorded. Patient is postmenopausal. Last Pap: Results were: 2017 NIL  Last mammogram: 07/26/2019  Results were: BI-RAD I  Obstetric History: G0P0  Family History:  Family History  Problem Relation Age of Onset   Breast cancer Cousin     Social History:  Social History   Socioeconomic History   Marital status: Married    Spouse name: Not on file   Number of children: Not on file   Years of education: Not on file   Highest education level: Not on file  Occupational History   Not on file  Social Needs   Financial resource strain: Not on file   Food insecurity    Worry: Not on file    Inability: Not on file   Transportation needs    Medical: Not on file    Non-medical: Not on file  Tobacco Use   Smoking status: Never Smoker   Smokeless tobacco: Never Used  Substance and Sexual Activity   Alcohol use: Yes   Drug use: No   Sexual activity: Yes    Birth control/protection: None, Post-menopausal    Lifestyle   Physical activity    Days per week: 7 days    Minutes per session: Not on file   Stress: Not at all  Relationships   Social connections    Talks on phone: More than three times a week    Gets together: More than three times a week    Attends religious service: Never    Active member of club or organization: No    Attends meetings of clubs or organizations: Never    Relationship status: Separated   Intimate partner violence    Fear of current or ex partner: No    Emotionally abused: No    Physically abused: No    Forced sexual activity: No  Other Topics Concern   Not on file  Social History Narrative   Not on file    Allergies:  No Known Allergies  Medications: Prior to Admission medications   Medication Sig Start Date End Date Taking? Authorizing Provider  estradiol-levonorgestrel [facility] PRO) 0.045-0.015 MG/DAY APPLY 1 PATCH ONCE A WEEK 08/22/19  Yes Jenasis Straley R, MD  metoCLOPramide (REGLAN) 5 MG tablet Take 1 tablet (5 mg total) by mouth as directed. Take one tablet one hour prior to colon prep and one tablet if needed during the prep 01/11/19  Yes Byrnett, Forest Gleason, MD  polyethylene glycol  powder (GLYCOLAX/MIRALAX) powder 255 grams one bottle for colonoscopy prep 01/11/19  Yes Byrnett, Forest Gleason, MD  Prasterone (INTRAROSA) 6.5 MG INST Place 6.5 mg vaginally at bedtime. 08/22/19  Yes Devonn Giampietro R, MD  estrogen, conjugated,-medroxyprogesterone (PREMPRO) 0.3-1.5 MG tablet Take 1 tablet by mouth daily. 09/07/19   Homero Fellers, MD    Physical Exam Vitals: Blood pressure 110/60, pulse 81, height 5\' 3"  (1.6 m), weight 144 lb (65.3 kg).  General: NAD HEENT: normocephalic, anicteric Thyroid: no enlargement, no palpable nodules Pulmonary: No increased work of breathing, CTAB Cardiovascular: RRR, distal pulses 2+ Breast: Breast symmetrical, no tenderness, no palpable nodules or masses, no skin or nipple retraction present, no nipple  discharge.  No axillary or supraclavicular lymphadenopathy. Abdomen: NABS, soft, non-tender, non-distended.  Umbilicus without lesions.  No hepatomegaly, splenomegaly or masses palpable. No evidence of hernia  Genitourinary:  External: Normal external female genitalia.  Normal urethral meatus, normal Bartholin's and Skene's glands.    Vagina: Normal vaginal mucosa, no evidence of prolapse.    Cervix: Grossly normal in appearance, no bleeding  Uterus: Non-enlarged, mobile, normal contour.  No CMT  Adnexa: ovaries non-enlarged, no adnexal masses  Rectal: deferred  Lymphatic: no evidence of inguinal lymphadenopathy Extremities: no edema, erythema, or tenderness Neurologic: Grossly intact Psychiatric: mood appropriate, affect full  Female chaperone present for pelvic and breast  portions of the physical exam     Assessment: 58 y.o. G0P0 routine annual exam  Plan: Problem List Items Addressed This Visit    None    Visit Diagnoses    Healthcare maintenance    -  Primary   Relevant Orders   Cytology - PAP   CBC   TSH + free T4   Comprehensive metabolic panel   Lipid panel   Cervical cancer screening       Relevant Orders   Cytology - PAP   Normal breast exam       Encounter for screening mammogram for malignant neoplasm of breast       Relevant Orders   MM DIGITAL SCREENING BILATERAL   Screening for deficiency anemia       Relevant Orders   CBC   Thyroid disorder screen       Relevant Orders   TSH + free T4   Screening cholesterol level       Relevant Orders   Lipid panel   Diabetes mellitus screening       Relevant Orders   Comprehensive metabolic panel   Vasomotor symptoms due to menopause       Relevant Medications   estrogen, conjugated,-medroxyprogesterone (PREMPRO) 0.3-1.5 MG tablet      1) Mammogram - recommend yearly screening mammogram.  Mammogram Is up to date  2) STI screening  wasoffered and declined  3) ASCCP guidelines and rational discussed.   Patient opts for every 3 years screening interval  4) Osteoporosis  - per USPTF routine screening DEXA at age 39 - FRAX 71 year major fracture risk 7.1,  10 year hip fracture risk 0.6  Consider FDA-approved medical therapies in postmenopausal women and men aged 58 years and older, based on the following: a) A hip or vertebral (clinical or morphometric) fracture b) T-score ? -2.5 at the femoral neck or spine after appropriate evaluation to exclude secondary causes C) Low bone mass (T-score between -1.0 and -2.5 at the femoral neck or spine) and a 10-year probability of a hip fracture ? 3% or a 10-year probability of a major osteoporosis-related  fracture ? 20% based on the [place]-adapted WHO algorithm   5) Routine healthcare maintenance including cholesterol, diabetes screening discussed To return fasting at a later date  6) Colonoscopy last performed in 2014.  Screening recommended starting at age 47 for average risk individuals, age 26 for individuals deemed at increased risk (including African Americans) and recommended to continue until age 73.  For patient age 59-85 individualized approach is recommended.  Gold standard screening is via colonoscopy, Cologuard screening is an acceptable alternative for patient unwilling or unable to undergo colonoscopy.  "Colorectal cancer screening for average?risk adults: 2018 guideline update from the American Cancer Society"[place]: A Cancer Journal for Clinicians: Apr 06, 2017    7)Discussed weaning estrogen replacement therapy since she is approaching 47. She would like to continue this medication until the age of 28. New rx sent for oral HRT since she is having skin reaction to the patch.   8) She is happy with intrarosa and is continuing therapy.   9) Return in about 1 year (around 09/06/2020) for fasting lap appointment ASAP, annual 1 year.  Adrian Prows MD [facility] 09/07/2019 6:01 PM

## 2019-09-12 LAB — CYTOLOGY - PAP
Comment: NEGATIVE
Diagnosis: NEGATIVE
High risk HPV: NEGATIVE

## 2019-09-12 NOTE — Progress Notes (Signed)
WNL- released to mychart

## 2019-09-13 ENCOUNTER — Other Ambulatory Visit: Payer: BC Managed Care – PPO

## 2019-09-17 ENCOUNTER — Other Ambulatory Visit: Payer: Self-pay

## 2019-09-17 ENCOUNTER — Other Ambulatory Visit: Payer: BC Managed Care – PPO

## 2019-09-17 DIAGNOSIS — Z130 Encounter for screening for diseases of the blood and blood-forming organs and certain disorders involving the immune mechanism: Secondary | ICD-10-CM

## 2019-09-17 DIAGNOSIS — Z131 Encounter for screening for diabetes mellitus: Secondary | ICD-10-CM

## 2019-09-17 DIAGNOSIS — Z0000 Encounter for general adult medical examination without abnormal findings: Secondary | ICD-10-CM

## 2019-09-17 DIAGNOSIS — Z13220 Encounter for screening for lipoid disorders: Secondary | ICD-10-CM

## 2019-09-17 DIAGNOSIS — Z1329 Encounter for screening for other suspected endocrine disorder: Secondary | ICD-10-CM

## 2019-09-18 LAB — CBC
Hematocrit: 41.7 % (ref 34.0–46.6)
Hemoglobin: 14.3 g/dL (ref 11.1–15.9)
MCH: 31.3 pg (ref 26.6–33.0)
MCHC: 34.3 g/dL (ref 31.5–35.7)
MCV: 91 fL (ref 79–97)
Platelets: 171 10*3/uL (ref 150–450)
RBC: 4.57 x10E6/uL (ref 3.77–5.28)
RDW: 12.9 % (ref 11.7–15.4)
WBC: 4.2 10*3/uL (ref 3.4–10.8)

## 2019-09-18 LAB — LIPID PANEL
Chol/HDL Ratio: 1.7 ratio (ref 0.0–4.4)
Cholesterol, Total: 188 mg/dL (ref 100–199)
HDL: 108 mg/dL (ref >39)
LDL Chol Calc (NIH): 71 mg/dL (ref 0–99)
Triglycerides: 41 mg/dL (ref 0–149)
VLDL Cholesterol Cal: 9 mg/dL (ref 5–40)

## 2019-09-18 LAB — COMPREHENSIVE METABOLIC PANEL
ALT: 15 IU/L (ref 0–32)
AST: 16 IU/L (ref 0–40)
Albumin/Globulin Ratio: 1.8 (ref 1.2–2.2)
Albumin: 4.5 g/dL (ref 3.8–4.9)
Alkaline Phosphatase: 37 IU/L — ABNORMAL LOW (ref 39–117)
BUN/Creatinine Ratio: 21 (ref 9–23)
BUN: 15 mg/dL (ref 6–24)
Bilirubin Total: 1.1 mg/dL (ref 0.0–1.2)
CO2: 26 mmol/L (ref 20–29)
Calcium: 9.2 mg/dL (ref 8.7–10.2)
Chloride: 100 mmol/L (ref 96–106)
Creatinine, Ser: 0.7 mg/dL (ref 0.57–1.00)
GFR calc Af Amer: 110 mL/min/{1.73_m2} (ref >59)
GFR calc non Af Amer: 95 mL/min/{1.73_m2} (ref >59)
Globulin, Total: 2.5 g/dL (ref 1.5–4.5)
Glucose: 86 mg/dL (ref 65–99)
Potassium: 4.3 mmol/L (ref 3.5–5.2)
Sodium: 142 mmol/L (ref 134–144)
Total Protein: 7 g/dL (ref 6.0–8.5)

## 2019-09-18 LAB — TSH+FREE T4
Free T4: 1.24 ng/dL (ref 0.82–1.77)
TSH: 2.53 u[IU]/mL (ref 0.450–4.500)

## 2019-09-18 NOTE — Progress Notes (Signed)
WNL- released to mychart with a note

## 2019-09-19 ENCOUNTER — Telehealth: Payer: Self-pay

## 2019-09-19 NOTE — Telephone Encounter (Signed)
Pt called to tell [facility] that in 2021 her insurance will no longer cover Intrarosa that she is currently on. They would like her to be put on Imvexy instead because that's what her plan will pay for. Thank you

## 2019-09-21 ENCOUNTER — Other Ambulatory Visit: Payer: Self-pay | Admitting: Obstetrics and Gynecology

## 2019-09-21 DIAGNOSIS — N952 Postmenopausal atrophic vaginitis: Secondary | ICD-10-CM

## 2019-09-21 DIAGNOSIS — N941 Unspecified dyspareunia: Secondary | ICD-10-CM

## 2019-09-21 MED ORDER — IMVEXXY MAINTENANCE PACK 4 MCG VA INST: 4.0000 ug | VAGINAL_INSERT | VAGINAL | 11 refills | Status: DC

## 2019-09-21 NOTE — Telephone Encounter (Signed)
Pt aware.

## 2019-09-21 NOTE — Telephone Encounter (Signed)
New prescription sent- please inform patient

## 2019-10-22 ENCOUNTER — Other Ambulatory Visit: Payer: Self-pay

## 2019-10-22 ENCOUNTER — Other Ambulatory Visit
Admission: RE | Admit: 2019-10-22 | Discharge: 2019-10-22 | Disposition: A | Discharge status: Home / Self Care | Payer: BC Managed Care – PPO | Source: Ambulatory Visit | Attending: Surgery | Admitting: Surgery

## 2019-10-22 DIAGNOSIS — Z01812 Encounter for preprocedural laboratory examination: Secondary | ICD-10-CM | POA: Insufficient documentation

## 2019-10-22 DIAGNOSIS — Z20828 Contact with and (suspected) exposure to other viral communicable diseases: Secondary | ICD-10-CM | POA: Insufficient documentation

## 2019-10-22 LAB — SARS CORONAVIRUS 2 (TAT 6-24 HRS): SARS Coronavirus 2: NEGATIVE

## 2019-10-25 ENCOUNTER — Ambulatory Visit
Admission: RE | Admit: 2019-10-25 | Discharge: 2019-10-25 | Disposition: A | Discharge status: Home / Self Care | Payer: BC Managed Care – PPO | Attending: Surgery | Admitting: Surgery

## 2019-10-25 ENCOUNTER — Encounter
Admission: RE | Disposition: A | Discharge status: Home / Self Care | Payer: Self-pay | Source: Home / Self Care | Attending: Surgery

## 2019-10-25 ENCOUNTER — Ambulatory Visit: Payer: BC Managed Care – PPO | Admitting: Certified Registered Nurse Anesthetist

## 2019-10-25 ENCOUNTER — Encounter: Payer: Self-pay | Admitting: Surgery

## 2019-10-25 DIAGNOSIS — K644 Residual hemorrhoidal skin tags: Secondary | ICD-10-CM | POA: Diagnosis not present

## 2019-10-25 DIAGNOSIS — Z86010 Personal history of colonic polyps: Secondary | ICD-10-CM | POA: Insufficient documentation

## 2019-10-25 DIAGNOSIS — K640 First degree hemorrhoids: Secondary | ICD-10-CM | POA: Insufficient documentation

## 2019-10-25 DIAGNOSIS — Z1211 Encounter for screening for malignant neoplasm of colon: Secondary | ICD-10-CM | POA: Diagnosis not present

## 2019-10-25 HISTORY — PX: COLONOSCOPY WITH PROPOFOL: SHX5780

## 2019-10-25 SURGERY — COLONOSCOPY WITH PROPOFOL
Anesthesia: General

## 2019-10-25 MED ORDER — PROPOFOL 500 MG/50ML IV EMUL
INTRAVENOUS | Status: AC
  Filled 2019-10-25: qty 50

## 2019-10-25 MED ORDER — LIDOCAINE HCL (CARDIAC) PF 100 MG/5ML IV SOSY
PREFILLED_SYRINGE | INTRAVENOUS | Status: DC | PRN
  Administered 2019-10-25: 100 mg via INTRAVENOUS

## 2019-10-25 MED ORDER — PROPOFOL 10 MG/ML IV BOLUS
INTRAVENOUS | Status: DC | PRN
  Administered 2019-10-25: 30 mg via INTRAVENOUS
  Administered 2019-10-25: 40 mg via INTRAVENOUS
  Administered 2019-10-25: 100 mg via INTRAVENOUS

## 2019-10-25 MED ORDER — SODIUM CHLORIDE 0.9 % IV SOLN: INTRAVENOUS | Status: DC

## 2019-10-25 MED ORDER — PROPOFOL 500 MG/50ML IV EMUL
INTRAVENOUS | Status: DC | PRN
  Administered 2019-10-25: 120 ug/kg/min via INTRAVENOUS

## 2019-10-25 NOTE — Anesthesia Postprocedure Evaluation (Signed)
Anesthesia Post Note  Patient: Christina Neal  Procedure(s) Performed: COLONOSCOPY WITH PROPOFOL (N/A )  Patient location during evaluation: PACU Anesthesia Type: General Level of consciousness: awake and alert Pain management: pain level controlled Vital Signs Assessment: post-procedure vital signs reviewed and stable Respiratory status: spontaneous breathing, nonlabored ventilation, respiratory function stable and patient connected to nasal cannula oxygen Cardiovascular status: blood pressure returned to baseline and stable Postop Assessment: no apparent nausea or vomiting Anesthetic complications: no     Last Vitals:  Vitals:   10/25/19 1228 10/25/19 1329  BP: 125/86 (!) 137/95  Pulse: 72 94  Resp: 16 15  Temp: (!) 36.4 C 36.4 C  SpO2: 100% 98%    Last Pain:  Vitals:   10/25/19 1329  TempSrc:   PainSc: 0-No pain                 Molli Barrows

## 2019-10-25 NOTE — Anesthesia Post-op Follow-up Note (Signed)
Anesthesia QCDR form completed.        

## 2019-10-25 NOTE — Transfer of Care (Signed)
Immediate Anesthesia Transfer of Care Note  Patient: Christina Neal  Procedure(s) Performed: COLONOSCOPY WITH PROPOFOL (N/A )  Patient Location: PACU and Endoscopy Unit  Anesthesia Type:General  Level of Consciousness: awake, drowsy and patient cooperative  Airway & Oxygen Therapy: Patient Spontanous Breathing  Post-op Assessment: Report given to RN, Post -op Vital signs reviewed and stable and Patient moving all extremities  Post vital signs: Reviewed and stable  Last Vitals:  Vitals Value Taken Time  BP 137/95 10/25/19 1329  Temp    Pulse 94 10/25/19 1329  Resp 15 10/25/19 1329  SpO2 98 % 10/25/19 1329  Vitals shown include unvalidated device data.  Last Pain:  Vitals:   10/25/19 1228  TempSrc: Temporal  PainSc: 7          Complications: No apparent anesthesia complications

## 2019-10-25 NOTE — Interval H&P Note (Signed)
History and Physical Interval Note:  10/25/2019 12:30 PM  Christina Neal  has presented today for surgery, with the diagnosis of HX COLON POLYPS.  The various methods of treatment have been discussed with the patient and family. After consideration of risks, benefits and other options for treatment, the patient has consented to  Procedure(s): COLONOSCOPY WITH PROPOFOL (N/A) as a surgical intervention.  The patient's history has been reviewed, patient examined, no change in status, stable for surgery.  I have reviewed the patient's chart and labs.  Questions were answered to the patient's satisfaction.     Vara Mairena Lysle Pearl

## 2019-10-25 NOTE — Op Note (Signed)
[facility] Gastroenterology Patient Name: Christina Neal Procedure Date: 10/25/2019 11:57 AM MRN: [id number] Account #: [id number] Date of Birth: 10/09/1961 Admit Type: Outpatient Age: 58 Room: [facility] ENDO ROOM 1 Gender: Female Note Status: Finalized Procedure:             Colonoscopy Indications:           Personal history of colonic polyps Providers:             Eliseo Squires MD, MD Referring MD:          Leona Carry. Hall Busing, MD (Referring MD) Medicines:             Propofol per Anesthesia Complications:         No immediate complications. Procedure:             Pre-Anesthesia Assessment:                        - After reviewing the risks and benefits, the patient                         was deemed in satisfactory condition to undergo the                         procedure in an ambulatory setting.                        After obtaining informed consent, the colonoscope was                         passed under direct vision. Throughout the procedure,                         the patient's blood pressure, pulse, and oxygen                         saturations were monitored continuously. The                         Colonoscope was introduced through the anus and                         advanced to the the cecum, identified by the ileocecal                         valve. The colonoscopy was performed without                         difficulty. The patient tolerated the procedure well.                         The quality of the bowel preparation was good. Findings:      Skin tags were found on perianal exam.      Non-bleeding internal hemorrhoids were found during retroflexion. The       hemorrhoids were Grade I (internal hemorrhoids that do not prolapse).      The exam was otherwise without abnormality. Impression:            - Perianal skin tags found on perianal exam.                        -  Non-bleeding internal hemorrhoids.                        - The examination was  otherwise normal.                        - No specimens collected. Recommendation:        - Written discharge instructions were provided to the                         patient.                        - Resume regular diet.                        - Repeat colonoscopy in 5-10 years for screening                         purposes.                        - Discharge patient to home. Procedure Code(s):     --- Professional ---                        [phone], Colonoscopy, flexible; diagnostic, including                         collection of specimen(s) by brushing or washing, when                         performed (separate procedure) Diagnosis Code(s):     --- Professional ---                        K64.0, First degree hemorrhoids                        K64.4, Residual hemorrhoidal skin tags                        Z86.010, Personal history of colonic polyps CPT copyright 2019 American Medical Association. All rights reserved. The codes documented in this report are preliminary and upon coder review may  be revised to meet current compliance requirements. Dr. Sheppard Penton, MD Eliseo Squires MD, MD 10/25/2019 1:28:45 PM This report has been signed electronically. Number of Addenda: 0 Note Initiated On: 10/25/2019 11:57 AM Scope Withdrawal Time: 0 hours 10 minutes 45 seconds  Total Procedure Duration: 0 hours 20 minutes 35 seconds  Estimated Blood Loss:  Estimated blood loss: none.      [facility]

## 2019-10-25 NOTE — Anesthesia Preprocedure Evaluation (Signed)
Anesthesia Evaluation  Patient identified by MRN, date of birth, ID band Patient awake    Reviewed: Allergy & Precautions, H&P , NPO status , Patient's Chart, lab work & pertinent test results, reviewed documented beta blocker date and time   Airway Mallampati: II   Neck ROM: full    Dental  (+) Poor Dentition   Pulmonary neg pulmonary ROS,    Pulmonary exam normal        Cardiovascular negative cardio ROS Normal cardiovascular exam Rhythm:regular Rate:Normal     Neuro/Psych negative neurological ROS  negative psych ROS   GI/Hepatic negative GI ROS, Neg liver ROS,   Endo/Other  negative endocrine ROS  Renal/GU negative Renal ROS  negative genitourinary   Musculoskeletal   Abdominal   Peds  Hematology negative hematology ROS (+)   Anesthesia Other Findings Past Medical History: 2014: Colon polyp Past Surgical History: 2000: CRANIOTOMY 1992: LAPAROSCOPY BMI    Body Mass Index: 24.80 kg/m     Reproductive/Obstetrics negative OB ROS                             Anesthesia Physical Anesthesia Plan  ASA: I  Anesthesia Plan: General   Post-op Pain Management:    Induction:   PONV Risk Score and Plan:   Airway Management Planned:   Additional Equipment:   Intra-op Plan:   Post-operative Plan:   Informed Consent: I have reviewed the patients History and Physical, chart, labs and discussed the procedure including the risks, benefits and alternatives for the proposed anesthesia with the patient or authorized representative who has indicated his/her understanding and acceptance.     Dental Advisory Given  Plan Discussed with: CRNA  Anesthesia Plan Comments:         Anesthesia Quick Evaluation

## 2019-10-25 NOTE — H&P (Signed)
Subjective:   CC: Hx of colonic polyps [Z86.010]  HPI:  Christina Neal is a 58 y.o. female who is here for above.  Last c-scope in 2014, with tubular adenoma.  No symptoms since.  No change in medical history.  Not on any blood thinners.   Past Medical History: none reported  Past Surgical History: none reported  Family History: family history includes Atrial fibrillation (Abnormal heart rhythm sometimes requiring treatment with blood thinners) in her mother; High blood pressure (Hypertension) in her father.  Social History:  reports that she has never smoked. She has never used smokeless tobacco. She reports that she does not drink alcohol or use drugs.  Current Medications: has a current medication list which includes the following prescription(s): climara pro, naproxen, and intrarosa.  Allergies:  No Known Allergies  ROS:  A 15 point review of systems was performed and pertinent positives and negatives noted in HPI   Objective:   BP 127/89   Pulse 68   Ht 160 cm (5\' 3" )   Wt 64.4 kg (142 lb)   BMI 25.15 kg/m   Constitutional :  alert, appears stated age, cooperative and no distress  Lymphatics/Throat:  no asymmetry, masses, or scars  Respiratory:  clear to auscultation bilaterally  Cardiovascular:  regular rate and rhythm  Gastrointestinal: soft, non-tender; bowel sounds normal; no masses,  no organomegaly.    Musculoskeletal: Steady gait and movement  Skin: Cool and moist  Psychiatric: Normal affect, non-agitated, not confused       LABS:  n/a  RADS: n/a  Assessment:      Hx of colonic polyps [Z86.010]  Plan:   Due for another screening colonoscopy.  Risk benefits alternatives discussed with patient.  Risks include bleeding and perforation.  Benefits include pathologic diagnosis and possible curative treatment.  Alternative includes continued monitoring.  Patient verbalized understanding and would like to proceed.  Prep instructions will be  given.

## 2019-10-26 ENCOUNTER — Encounter: Payer: Self-pay | Admitting: *Deleted

## 2019-11-11 ENCOUNTER — Other Ambulatory Visit: Payer: Self-pay | Admitting: Obstetrics and Gynecology

## 2019-11-11 DIAGNOSIS — R232 Flushing: Secondary | ICD-10-CM

## 2020-01-24 ENCOUNTER — Ambulatory Visit: Payer: Commercial Managed Care - PPO | Attending: Internal Medicine

## 2020-01-24 DIAGNOSIS — Z23 Encounter for immunization: Secondary | ICD-10-CM

## 2020-01-24 NOTE — Progress Notes (Signed)
   Covid-19 Vaccination Clinic  Name:  Christina Neal    MRN: [id number] DOB: [birth year]  01/24/2020  [name] was observed post Covid-19 immunization for 15 minutes without incident. She was provided with Vaccine Information Sheet and instruction to access the V-Safe system.   [name] was instructed to call 911 with any severe reactions post vaccine: Marland Kitchen Difficulty breathing  . Swelling of face and throat  . A fast heartbeat  . A bad rash all over body  . Dizziness and weakness   Immunizations Administered    Name Date Dose VIS Date Route   Pfizer COVID-19 Vaccine 01/24/2020  8:51 AM 0.3 mL 10/19/2019 Intramuscular   Manufacturer: [place]   Lot: [id number]   [place]: [id number]

## 2020-02-18 ENCOUNTER — Ambulatory Visit: Payer: Commercial Managed Care - PPO | Attending: Internal Medicine

## 2020-02-18 DIAGNOSIS — Z23 Encounter for immunization: Secondary | ICD-10-CM

## 2020-02-18 NOTE — Progress Notes (Signed)
   Covid-19 Vaccination Clinic  Name:  SICILY MAUND    MRN: [id number] DOB: 1961/09/15  02/18/2020  Ms. Simenson was observed post Covid-19 immunization for 15 minutes without incident. She was provided with Vaccine Information Sheet and instruction to access the V-Safe system.   Ms. Popal was instructed to call 911 with any severe reactions post vaccine: Marland Kitchen Difficulty breathing  . Swelling of face and throat  . A fast heartbeat  . A bad rash all over body  . Dizziness and weakness   Immunizations Administered    Name Date Dose VIS Date Route   Pfizer COVID-19 Vaccine 02/18/2020  1:40 PM 0.3 mL 10/19/2019 Intramuscular   Manufacturer: [place]   Lot: [id number]   [place]: [id number]

## 2020-05-05 ENCOUNTER — Other Ambulatory Visit: Payer: Self-pay | Admitting: Obstetrics and Gynecology

## 2020-05-05 DIAGNOSIS — Z01419 Encounter for gynecological examination (general) (routine) without abnormal findings: Secondary | ICD-10-CM

## 2020-05-05 DIAGNOSIS — N941 Unspecified dyspareunia: Secondary | ICD-10-CM

## 2020-05-05 DIAGNOSIS — N898 Other specified noninflammatory disorders of vagina: Secondary | ICD-10-CM

## 2020-05-06 ENCOUNTER — Telehealth: Payer: Self-pay

## 2020-05-06 NOTE — Telephone Encounter (Signed)
Pt calling; [facility] is telling her she has no refills on her premarin tablet; should have several refills on it.  [phone]  Call pharm; spoke to [place] who states she has 10 refills on it.  Pt aware.

## 2020-07-02 ENCOUNTER — Ambulatory Visit (INDEPENDENT_AMBULATORY_CARE_PROVIDER_SITE_OTHER): Payer: BC Managed Care – PPO | Admitting: Dermatology

## 2020-07-02 ENCOUNTER — Other Ambulatory Visit: Payer: Self-pay

## 2020-07-02 ENCOUNTER — Encounter: Payer: Self-pay | Admitting: Dermatology

## 2020-07-02 DIAGNOSIS — K130 Diseases of lips: Secondary | ICD-10-CM | POA: Diagnosis not present

## 2020-07-02 DIAGNOSIS — L578 Other skin changes due to chronic exposure to nonionizing radiation: Secondary | ICD-10-CM

## 2020-07-02 DIAGNOSIS — W57XXXA Bitten or stung by nonvenomous insect and other nonvenomous arthropods, initial encounter: Secondary | ICD-10-CM | POA: Diagnosis not present

## 2020-07-02 DIAGNOSIS — L719 Rosacea, unspecified: Secondary | ICD-10-CM

## 2020-07-02 DIAGNOSIS — S80862A Insect bite (nonvenomous), left lower leg, initial encounter: Secondary | ICD-10-CM

## 2020-07-02 DIAGNOSIS — D229 Melanocytic nevi, unspecified: Secondary | ICD-10-CM

## 2020-07-02 MED ORDER — IVERMECTIN 1 % EX CREA: TOPICAL_CREAM | CUTANEOUS | 6 refills | Status: DC

## 2020-07-02 MED ORDER — HYDROCORTISONE 2.5 % EX CREA: TOPICAL_CREAM | Freq: Two times a day (BID) | CUTANEOUS | 1 refills | Status: DC | PRN

## 2020-07-02 MED ORDER — KETOCONAZOLE 2 % EX CREA: 1.0000 "application " | TOPICAL_CREAM | Freq: Two times a day (BID) | CUTANEOUS | 1 refills | Status: AC

## 2020-07-02 MED ORDER — MUPIROCIN 2 % EX OINT: 1.0000 "application " | TOPICAL_OINTMENT | Freq: Two times a day (BID) | CUTANEOUS | 1 refills | Status: DC

## 2020-07-02 MED ORDER — CLOBETASOL PROPIONATE 0.05 % EX OINT: 1.0000 "application " | TOPICAL_OINTMENT | Freq: Two times a day (BID) | CUTANEOUS | 1 refills | Status: DC

## 2020-07-02 NOTE — Patient Instructions (Signed)
Recommend daily broad spectrum sunscreen SPF 30+ to sun-exposed areas, reapply every 2 hours as needed. Call for new or changing lesions.  

## 2020-07-02 NOTE — Progress Notes (Signed)
   Follow-Up Visit   Subjective  Christina Neal is a 59 y.o. female who presents for the following: Rosacea.  Patient presents today for follow up visit on Rosacea. Patient states that it is doing much better and would like refill of her medications. She is currently using Soolantra and metronidazole  One area on her lip will not heal despite using the medicine   The following portions of the chart were reviewed this encounter and updated as appropriate:  Tobacco  Allergies  Meds  Problems  Med Hx  Surg Hx  Fam Hx      Review of Systems:  No other skin or systemic complaints except as noted in HPI or Assessment and Plan.  Objective  Well appearing patient in no apparent distress; mood and affect are within normal limits.  A focused examination was performed including head, including the scalp, face, neck, nose, ears, eyelids, and lips;  Neck, arms, hands, legs. Relevant physical exam findings are noted in the Assessment and Plan.  Objective  Mid Face: Mid face erythema  Objective  Left Oral Commissure: Scaly pink macule left oral commisure  Objective  Left Lower Leg - Posterior: Multiple pink edematous excoriated papules   Assessment & Plan  Rosacea Mid Face  Chronic, well controlled.  Recommend BBL treatment  D/c metronidazole (has failed metronidazole cream/incomplete response) Continue Soolantra increasing to twice daily   Ivermectin (SOOLANTRA) 1 % CREA - Mid Face  Angular cheilitis Left Oral Commissure  Present for 1 month. Bothersome.   Start hydrocortisone, ketoconazole and the mupirocin ointment twice a day until clear   hydrocortisone 2.5 % cream - Left Oral Commissure  ketoconazole (NIZORAL) 2 % cream - Left Oral Commissure  mupirocin ointment (BACTROBAN) 2 % - Left Oral Commissure  Bug bite, initial encounter Left Lower Leg - Posterior  Start Clobetasol ointment twice a day as needed  Avoid applying to face, groin, and axilla. Use  as directed. Risk of skin atrophy with long-term use reviewed.   Topical steroids (such as triamcinolone, fluocinolone, fluocinonide, mometasone, clobetasol, halobetasol, betamethasone, hydrocortisone) can cause thinning and lightening of the skin if they are used for too long in the same area. Your physician has selected the right strength medicine for your problem and area affected on the body. Please use your medication only as directed by your physician to prevent side effects.    clobetasol ointment (TEMOVATE) 0.05 % - Left Lower Leg - Posterior   Melanocytic Nevi - Tan-brown and/or pink-flesh-colored symmetric macules and papules - Benign appearing on exam today - Observation - Call clinic for new or changing moles - Recommend daily use of broad spectrum spf 30+ sunscreen to sun-exposed areas.   Actinic Damage - diffuse scaly erythematous macules with underlying dyspigmentation - Recommend daily broad spectrum sunscreen SPF 30+ to sun-exposed areas, reapply every 2 hours as needed.  - Call for new or changing lesions.   Return in about 1 year (around 07/02/2021) for Rosacea.   IDonzetta Kohut, CMA, am acting as scribe for Forest Gleason, MD .  Documentation: I have reviewed the above documentation for accuracy and completeness, and I agree with the above.  Forest Gleason, MD

## 2020-09-18 ENCOUNTER — Other Ambulatory Visit: Payer: Self-pay | Admitting: Obstetrics and Gynecology

## 2020-09-18 DIAGNOSIS — N951 Menopausal and female climacteric states: Secondary | ICD-10-CM

## 2020-09-25 ENCOUNTER — Ambulatory Visit (INDEPENDENT_AMBULATORY_CARE_PROVIDER_SITE_OTHER): Payer: BC Managed Care – PPO | Admitting: Obstetrics and Gynecology

## 2020-09-25 ENCOUNTER — Other Ambulatory Visit: Payer: Self-pay

## 2020-09-25 ENCOUNTER — Encounter: Payer: Self-pay | Admitting: Obstetrics and Gynecology

## 2020-09-25 VITALS — BP 116/70 | Ht 63.0 in | Wt 150.6 lb

## 2020-09-25 DIAGNOSIS — Z1231 Encounter for screening mammogram for malignant neoplasm of breast: Secondary | ICD-10-CM

## 2020-09-25 DIAGNOSIS — Z124 Encounter for screening for malignant neoplasm of cervix: Secondary | ICD-10-CM | POA: Diagnosis not present

## 2020-09-25 DIAGNOSIS — N951 Menopausal and female climacteric states: Secondary | ICD-10-CM

## 2020-09-25 DIAGNOSIS — N952 Postmenopausal atrophic vaginitis: Secondary | ICD-10-CM

## 2020-09-25 DIAGNOSIS — Z0000 Encounter for general adult medical examination without abnormal findings: Secondary | ICD-10-CM | POA: Diagnosis not present

## 2020-09-25 DIAGNOSIS — N941 Unspecified dyspareunia: Secondary | ICD-10-CM

## 2020-09-25 DIAGNOSIS — Z01419 Encounter for gynecological examination (general) (routine) without abnormal findings: Secondary | ICD-10-CM

## 2020-09-25 MED ORDER — IMVEXXY MAINTENANCE PACK 4 MCG VA INST: 4.0000 ug | VAGINAL_INSERT | VAGINAL | 11 refills | Status: DC

## 2020-09-25 MED ORDER — PREMPRO 0.3-1.5 MG PO TABS: 1.0000 | ORAL_TABLET | Freq: Every day | ORAL | 12 refills | Status: DC

## 2020-09-25 NOTE — Progress Notes (Signed)
Gynecology Annual Exam  PCP: Albina Billet, MD  Chief Complaint:  Chief Complaint  Patient presents with  . Gynecologic Exam    History of Present Illness: Patient is a 59 y.o. G0P0 presents for annual exam. The patient has no complaints today.   LMP: No LMP recorded. Patient is postmenopausal. Denies postmenopausal bleeding  The patient is sexually active. She has dyspareunia.  Postcoital Bleeding: no She currently uses none for contraception.    The patient does perform self breast exams.  There is no notable family history of breast or ovarian cancer in her family.  The patient has regular exercise: yes, 5 times a week walking/ running  The patient denies current symptoms of depression.    Review of Systems: Review of Systems  Constitutional: Negative for chills, fever, malaise/fatigue and weight loss.  HENT: Negative for congestion, hearing loss and sinus pain.   Eyes: Negative for blurred vision and double vision.  Respiratory: Negative for cough, sputum production, shortness of breath and wheezing.   Cardiovascular: Negative for chest pain, palpitations, orthopnea and leg swelling.  Gastrointestinal: Negative for abdominal pain, constipation, diarrhea, nausea and vomiting.  Genitourinary: Negative for dysuria, flank pain, frequency, hematuria and urgency.  Musculoskeletal: Negative for back pain, falls and joint pain.  Skin: Negative for itching and rash.  Neurological: Negative for dizziness and headaches.  Psychiatric/Behavioral: Negative for depression, substance abuse and suicidal ideas. The patient is not nervous/anxious.     Past Medical History:  Past Medical History:  Diagnosis Date  . Colon polyp 2014    Past Surgical History:  Past Surgical History:  Procedure Laterality Date  . COLONOSCOPY WITH PROPOFOL N/A 10/25/2019   Procedure: COLONOSCOPY WITH PROPOFOL;  Surgeon: Benjamine Sprague, DO;  Location: [facility];  Service: General;  Laterality: N/A;   . CRANIOTOMY  2000  . LAPAROSCOPY  1992    Gynecologic History:  No LMP recorded. Patient is postmenopausal. Contraception: none Last Pap: Results were: 2020  NIL and HR HPV negative  Last mammogram: 2020  Results were: BI-RAD I Obstetric History: G0P0  Family History:  Family History  Problem Relation Age of Onset  . Breast cancer Cousin     Social History:  Social History   Socioeconomic History  . Marital status: Married    Spouse name: Not on file  . Number of children: Not on file  . Years of education: Not on file  . Highest education level: Not on file  Occupational History  . Not on file  Tobacco Use  . Smoking status: Never Smoker  . Smokeless tobacco: Never Used  Vaping Use  . Vaping Use: Never used  Substance and Sexual Activity  . Alcohol use: Yes  . Drug use: No  . Sexual activity: Yes    Birth control/protection: None, Post-menopausal  Other Topics Concern  . Not on file  Social History Narrative  . Not on file   Social Determinants of Health   Financial Resource Strain:   . Difficulty of Paying Living Expenses: Not on file  Food Insecurity:   . Worried About Charity fundraiser in the Last Year: Not on file  . Ran Out of Food in the Last Year: Not on file  Transportation Needs:   . Lack of Transportation (Medical): Not on file  . Lack of Transportation (Non-Medical): Not on file  Physical Activity:   . Days of Exercise per Week: Not on file  . Minutes of Exercise per Session: Not  on file  Stress:   . Feeling of Stress : Not on file  Social Connections:   . Frequency of Communication with Friends and Family: Not on file  . Frequency of Social Gatherings with Friends and Family: Not on file  . Attends Religious Services: Not on file  . Active Member of Clubs or Organizations: Not on file  . Attends Archivist Meetings: Not on file  . Marital Status: Not on file  Intimate Partner Violence:   . Fear of Current or Ex-Partner: Not  on file  . Emotionally Abused: Not on file  . Physically Abused: Not on file  . Sexually Abused: Not on file    Allergies:  No Known Allergies  Medications: Prior to Admission medications   Medication Sig Start Date End Date Taking? Authorizing Provider  Estradiol (IMVEXXY MAINTENANCE PACK) 4 MCG INST Place 4 mcg vaginally 2 (two) times a week. 09/25/20  Yes Layni Kreamer R, MD  estrogen, conjugated,-medroxyprogesterone (PREMPRO) 0.3-1.5 MG tablet Take 1 tablet by mouth daily. 09/25/20  Yes Enis Riecke R, MD  clobetasol ointment (TEMOVATE) 1.61 % Apply 1 application topically 2 (two) times daily. Patient not taking: Reported on 09/25/2020 07/02/20   Laurence Ferrari, [place], MD  hydrocortisone 2.5 % cream Apply topically 2 (two) times daily as needed (Rash). Patient not taking: Reported on 09/25/2020 07/02/20   Laurence Ferrari, [place], MD  Ivermectin (SOOLANTRA) 1 % CREA Apply to affected areas on face once daily Patient not taking: Reported on 09/25/2020 07/02/20   Laurence Ferrari, [place], MD  metoCLOPramide (REGLAN) 5 MG tablet Take 1 tablet (5 mg total) by mouth as directed. Take one tablet one hour prior to colon prep and one tablet if needed during the prep Patient not taking: Reported on 09/25/2020 01/11/19   Robert Bellow, MD  mupirocin ointment (BACTROBAN) 2 % Apply 1 application topically 2 (two) times daily. Patient not taking: Reported on 09/25/2020 07/02/20   Laurence Ferrari, [place], MD  polyethylene glycol powder (GLYCOLAX/MIRALAX) powder 255 grams one bottle for colonoscopy prep Patient not taking: Reported on 09/25/2020 01/11/19   Robert Bellow, MD    Physical Exam Vitals: Blood pressure 116/70, height 5\' 3"  (1.6 m), weight 150 lb 9.6 oz (68.3 kg).  Physical Exam Constitutional:      Appearance: She is well-developed.  Genitourinary:     Vagina and uterus normal.     No lesions in the vagina.     No cervical motion tenderness.     No right or left adnexal mass present.      Genitourinary Comments: External: Normal appearing vulva. No lesions noted.  Bimanual examination: Uterus midline, non-tender, normal in size, shape and contour.  No CMT. No adnexal masses. No adnexal tenderness. Pelvis not fixed.   HENT:     Head: Normocephalic and atraumatic.  Neck:     Thyroid: No thyromegaly.  Cardiovascular:     Rate and Rhythm: Normal rate and regular rhythm.     Heart sounds: Normal heart sounds.  Pulmonary:     Effort: Pulmonary effort is normal.     Breath sounds: Normal breath sounds.  Chest:     Breasts:        Right: No inverted nipple, mass, nipple discharge or skin change.        Left: No inverted nipple, mass, nipple discharge or skin change.  Abdominal:     General: Bowel sounds are normal. There is no distension.     Palpations: Abdomen is soft. There is  no mass.  Musculoskeletal:     Cervical back: Neck supple.  Neurological:     Mental Status: She is alert and oriented to person, place, and time.  Skin:    General: Skin is warm and dry.  Psychiatric:        Behavior: Behavior normal.        Thought Content: Thought content normal.        Judgment: Judgment normal.  Vitals reviewed.      Female chaperone present for pelvic and breast  portions of the physical exam  Assessment: 59 y.o. G0P0 routine annual exam  Plan: Problem List Items Addressed This Visit    None    Visit Diagnoses    Health maintenance examination    -  Primary   Encounter for annual routine gynecological examination       Breast cancer screening by mammogram       Relevant Orders   MM 3D SCREEN BREAST BILATERAL   Encounter for gynecological examination without abnormal finding       Cervical cancer screening       Vaginal atrophy       Relevant Medications   Estradiol (IMVEXXY MAINTENANCE PACK) 4 MCG INST   Vasomotor symptoms due to menopause       Relevant Medications   estrogen, conjugated,-medroxyprogesterone (PREMPRO) 0.3-1.5 MG tablet   Dyspareunia in  female       Relevant Medications   Estradiol (IMVEXXY MAINTENANCE PACK) 4 MCG INST      1) Mammogram - recommend yearly screening mammogram.  Mammogram Was ordered today  2) STI screening was offered and accepted  3) ASCCP guidelines and rational discussed.  Patient opts for every 5 years screening interval  4) Hormone replacement therapy - patient desires to continue.   5) Colonoscopy -- up to date , 2020 completed needed next in 2030  6) Routine healthcare maintenance including cholesterol, diabetes screening discussed Declines  7)  Done density screening WNL. Plan for DEXA scan at 28.   Adrian Prows MD, [facility], [facility] 09/25/2020 9:12 AM

## 2020-09-25 NOTE — Patient Instructions (Signed)
Institute of [employer] Recommended Dietary Allowances for Calcium and Vitamin D  Age (yr) Calcium Recommended Dietary Allowance (mg/day) Vitamin D Recommended Dietary Allowance (international units/day)  9-18 1,300 600  19-50 1,000 600  51-70 1,200 600  71 and older 1,200 800  Data from [employer]. Dietary reference intakes: calcium, vitamin D. [place], [place]: National Academies Press; 2011.     Exercising to Stay Healthy To become healthy and stay healthy, it is recommended that you do moderate-intensity and vigorous-intensity exercise. You can tell that you are exercising at a moderate intensity if your heart starts beating faster and you start breathing faster but can still hold a conversation. You can tell that you are exercising at a vigorous intensity if you are breathing much harder and faster and cannot hold a conversation while exercising. Exercising regularly is important. It has many health benefits, such as:  Improving overall fitness, flexibility, and endurance.  Increasing bone density.  Helping with weight control.  Decreasing body fat.  Increasing muscle strength.  Reducing stress and tension.  Improving overall health. How often should I exercise? Choose an activity that you enjoy, and set realistic goals. Your health care provider can help you make an activity plan that works for you. Exercise regularly as told by your health care provider. This may include:  Doing strength training two times a week, such as: ? Lifting weights. ? Using resistance bands. ? Push-ups. ? Sit-ups. ? Yoga.  Doing a certain intensity of exercise for a given amount of time. Choose from these options: ? A total of 150 minutes of moderate-intensity exercise every week. ? A total of 75 minutes of vigorous-intensity exercise every week. ? A mix of moderate-intensity and vigorous-intensity exercise every week. Children, pregnant women, people who have not exercised  regularly, people who are overweight, and older adults may need to talk with a health care provider about what activities are safe to do. If you have a medical condition, be sure to talk with your health care provider before you start a new exercise program. What are some exercise ideas? Moderate-intensity exercise ideas include:  Walking 1 mile (1.6 km) in about 15 minutes.  Biking.  Hiking.  Golfing.  Dancing.  Water aerobics. Vigorous-intensity exercise ideas include:  Walking 4.5 miles (7.2 km) or more in about 1 hour.  Jogging or running 5 miles (8 km) in about 1 hour.  Biking 10 miles (16.1 km) or more in about 1 hour.  Lap swimming.  Roller-skating or in-line skating.  Cross-country skiing.  Vigorous competitive sports, such as football, basketball, and soccer.  Jumping rope.  Aerobic dancing. What are some everyday activities that can help me to get exercise?  Yard work, such as: ? Pushing a lawn mower. ? Raking and bagging leaves.  Washing your car.  Pushing a stroller.  Shoveling snow.  Gardening.  Washing windows or floors. How can I be more active in my day-to-day activities?  Use stairs instead of an elevator.  Take a walk during your lunch break.  If you drive, park your car farther away from your work or school.  If you take public transportation, get off one stop early and walk the rest of the way.  Stand up or walk around during all of your indoor phone calls.  Get up, stretch, and walk around every 30 minutes throughout the day.  Enjoy exercise with a friend. Support to continue exercising will help you keep a regular routine of activity. What guidelines can   I follow while exercising?  Before you start a new exercise program, talk with your health care provider.  Do not exercise so much that you hurt yourself, feel dizzy, or get very short of breath.  Wear comfortable clothes and wear shoes with good support.  Drink plenty of  water while you exercise to prevent dehydration or heat stroke.  Work out until your breathing and your heartbeat get faster. Where to find more information  [place] Department of Health and Human Services: www.hhs.gov  Centers for Disease Control and Prevention (CDC): www.cdc.gov Summary  Exercising regularly is important. It will improve your overall fitness, flexibility, and endurance.  Regular exercise also will improve your overall health. It can help you control your weight, reduce stress, and improve your bone density.  Do not exercise so much that you hurt yourself, feel dizzy, or get very short of breath.  Before you start a new exercise program, talk with your health care provider. This information is not intended to replace advice given to you by your health care provider. Make sure you discuss any questions you have with your health care provider. Document Revised: 10/07/2017 Document Reviewed: 09/15/2017 Elsevier Patient Education  2020 Else[employer] Inc.   Budget-Friendly Healthy Eating There are many ways to save money at the grocery store and continue to eat healthy. You can be successful if you:  Plan meals according to your budget.  Make a grocery list and only purchase food according to your grocery list.  Prepare food yourself. What are tips for following this plan?  Reading food labels  Compare food labels between brand name foods and the store brand. Often the nutritional value is the same, but the store brand is lower cost.  Look for products that do not have added sugar, fat, or salt (sodium). These often cost the same but are healthier for you. Products may be labeled as: ? Sugar-free. ? Nonfat. ? Low-fat. ? Sodium-free. ? Low-sodium.  Look for lean ground beef labeled as at least 92% lean and 8% fat. Shopping  Buy only the items on your grocery list and go only to the areas of the store that have the items on your list.  Use coupons only for foods  and brands you normally buy. Avoid buying items you wouldn't normally buy simply because they are on sale.  Check online and in newspapers for weekly deals.  Buy healthy items from the bulk bins when available, such as herbs, spices, flour, pasta, nuts, and dried fruit.  Buy fruits and vegetables that are in season. Prices are usually lower on in-season produce.  Look at the unit price on the price tag. Use it to compare different brands and sizes to find out which item is the best deal.  Choose healthy items that are often low-cost, such as carrots, potatoes, apples, bananas, and oranges. Dried or canned beans are a low-cost protein source.  Buy in bulk and freeze extra food. Items you can buy in bulk include meats, fish, poultry, frozen fruits, and frozen vegetables.  Avoid buying "ready-to-eat" foods, such as pre-cut fruits and vegetables and pre-made salads.  If possible, shop around to discover where you can find the best prices. Consider other retailers such as dollar stores, larger wholesale stores, local fruit and vegetable stands, and farmers markets.  Do not shop when you are hungry. If you shop while hungry, it may be hard to stick to your list and budget.  Resist impulse buying. Use your grocery list as   your official plan for the week.  Buy a variety of vegetables and fruits by purchasing fresh, frozen, and canned items.  Look at the top and bottom shelves for deals. Foods at eye level (eye level of an adult or child) are usually more expensive.  Be efficient with your time when shopping. The more time you spend at the store, the more money you are likely to spend.  To save money when choosing more expensive foods like meats and dairy: ? Choose cheaper cuts of meat, such as bone-in chicken thighs and drumsticks instead of skinless and boneless chicken. When you are ready to prepare the chicken, you can remove the skin yourself to make it healthier. ? Choose lean meats like  chicken or turkey instead of beef. ? Choose canned seafood, such as tuna, salmon, or sardines. ? Buy eggs as a low-cost source of protein. ? Buy dried beans and peas, such as lentils, split peas, or kidney beans instead of meats. Dried beans and peas are a good alternative source of protein. ? Buy the larger tubs of yogurt instead of individual-sized containers.  Choose water instead of sodas and other sweetened beverages.  Avoid buying chips, cookies, and other "junk food." These items are usually expensive and not healthy. Cooking  Make extra food and freeze the extras in meal-sized containers or in individual portions for fast meals and snacks.  Pre-cook on days when you have extra time to prepare meals in advance. You can keep these meals in the fridge or freezer and reheat for a quick meal.  When you come home from the grocery store, wash, peel, and cut fruits and vegetables so they are ready to use and eat. This will help reduce food waste. Meal planning  Do not eat out or get fast food. Prepare food at home.  Make a grocery list and make sure to bring it with you to the store. If you have a smart phone, you could use your phone to create your shopping list.  Plan meals and snacks according to a grocery list and budget you create.  Use leftovers in your meal plan for the week.  Look for recipes where you can cook once and make enough food for two meals.  Include budget-friendly meals like stews, casseroles, and stir-fry dishes.  Try some meatless meals or try "no cook" meals like salads.  Make sure that half your plate is filled with fruits or vegetables. Choose from fresh, frozen, or canned fruits and vegetables. If eating canned, remember to rinse them before eating. This will remove any excess salt added for packaging. Summary  Eating healthy on a budget is possible if you plan your meals according to your budget, purchase according to your budget and grocery list, and  prepare food yourself.  Tips for buying more food on a limited budget include buying generic brands, using coupons only for foods you normally buy, and buying healthy items from the bulk bins when available.  Tips for buying cheaper food to replace expensive food include choosing cheaper, lean cuts of meat, and buying dried beans and peas. This information is not intended to replace advice given to you by your health care provider. Make sure you discuss any questions you have with your health care provider. Document Revised: 10/26/2017 Document Reviewed: 10/26/2017 Elsevier Patient Education  2020 [employer] Inc.   Bone Health Bones protect organs, store calcium, anchor muscles, and support the whole body. Keeping your bones strong is important, especially as you   get older. You can take actions to help keep your bones strong and healthy. Why is keeping my bones healthy important?  Keeping your bones healthy is important because your body constantly replaces bone cells. Cells get old, and new cells take their place. As we age, we lose bone cells because the body may not be able to make enough new cells to replace the old cells. The amount of bone cells and bone tissue you have is referred to as bone mass. The higher your bone mass, the stronger your bones. The aging process leads to an overall loss of bone mass in the body, which can increase the likelihood of:  Joint pain and stiffness.  Broken bones.  A condition in which the bones become weak and brittle (osteoporosis). A large decline in bone mass occurs in older adults. In women, it occurs about the time of menopause. What actions can I take to keep my bones healthy? Good health habits are important for maintaining healthy bones. This includes eating nutritious foods and exercising regularly. To have healthy bones, you need to get enough of the right minerals and vitamins. Most nutrition experts recommend getting these nutrients from the  foods that you eat. In some cases, taking supplements may also be recommended. Doing certain types of exercise is also important for bone health. What are the nutritional recommendations for healthy bones?  Eating a well-balanced diet with plenty of calcium and vitamin D will help to protect your bones. Nutritional recommendations vary from person to person. Ask your health care provider what is healthy for you. Here are some general guidelines. Get enough calcium Calcium is the most important (essential) mineral for bone health. Most people can get enough calcium from their diet, but supplements may be recommended for people who are at risk for osteoporosis. Good sources of calcium include:  Dairy products, such as low-fat or nonfat milk, cheese, and yogurt.  Dark green leafy vegetables, such as bok choy and broccoli.  Calcium-fortified foods, such as orange juice, cereal, bread, soy beverages, and tofu products.  Nuts, such as almonds. Follow these recommended amounts for daily calcium intake:  Children, age 1-3: 700 mg.  Children, age 4-8: 1,000 mg.  Children, age 9-13: 1,300 mg.  Teens, age 14-18: 1,300 mg.  Adults, age 19-50: 1,000 mg.  Adults, age 51-70: ? Men: 1,000 mg. ? Women: 1,200 mg.  Adults, age 71 or older: 1,200 mg.  Pregnant and breastfeeding females: ? Teens: 1,300 mg. ? Adults: 1,000 mg. Get enough vitamin D Vitamin D is the most essential vitamin for bone health. It helps the body absorb calcium. Sunlight stimulates the skin to make vitamin D, so be sure to get enough sunlight. If you live in a cold climate or you do not get outside often, your health care provider may recommend that you take vitamin D supplements. Good sources of vitamin D in your diet include:  Egg yolks.  Saltwater fish.  Milk and cereal fortified with vitamin D. Follow these recommended amounts for daily vitamin D intake:  Children and teens, age 1-18: 600 international  units.  Adults, age 50 or younger: 400-800 international units.  Adults, age 51 or older: 800-1,000 international units. Get other important nutrients Other nutrients that are important for bone health include:  Phosphorus. This mineral is found in meat, poultry, dairy foods, nuts, and legumes. The recommended daily intake for adult men and adult women is 700 mg.  Magnesium. This mineral is found in seeds, nuts, dark   green vegetables, and legumes. The recommended daily intake for adult men is 400-420 mg. For adult women, it is 310-320 mg.  Vitamin K. This vitamin is found in green leafy vegetables. The recommended daily intake is 120 mg for adult men and 90 mg for adult women. What type of physical activity is best for building and maintaining healthy bones? Weight-bearing and strength-building activities are important for building and maintaining healthy bones. Weight-bearing activities cause muscles and bones to work against gravity. Strength-building activities increase the strength of the muscles that support bones. Weight-bearing and muscle-building activities include:  Walking and hiking.  Jogging and running.  Dancing.  Gym exercises.  Lifting weights.  Tennis and racquetball.  Climbing stairs.  Aerobics. Adults should get at least 30 minutes of moderate physical activity on most days. Children should get at least 60 minutes of moderate physical activity on most days. Ask your health care provider what type of exercise is best for you. How can I find out if my bone mass is low? Bone mass can be measured with an X-ray test called a bone mineral density (BMD) test. This test is recommended for all women who are age 65 or older. It may also be recommended for:  Men who are age 70 or older.  People who are at risk for osteoporosis because of: ? Having bones that break easily. ? Having a long-term disease that weakens bones, such as kidney disease or rheumatoid  arthritis. ? Having menopause earlier than normal. ? Taking medicine that weakens bones, such as steroids, thyroid hormones, or hormone treatment for breast cancer or prostate cancer. ? Smoking. ? Drinking three or more alcoholic drinks a day. If you find that you have a low bone mass, you may be able to prevent osteoporosis or further bone loss by changing your diet and lifestyle. Where can I find more information? For more information, check out the following websites:  National Osteoporosis Foundation: www.nof.org/patients  National Institutes of Health: www.bones.nih.gov  International Osteoporosis Foundation: www.iofbonehealth.org Summary  The aging process leads to an overall loss of bone mass in the body, which can increase the likelihood of broken bones and osteoporosis.  Eating a well-balanced diet with plenty of calcium and vitamin D will help to protect your bones.  Weight-bearing and strength-building activities are also important for building and maintaining strong bones.  Bone mass can be measured with an X-ray test called a bone mineral density (BMD) test. This information is not intended to replace advice given to you by your health care provider. Make sure you discuss any questions you have with your health care provider. Document Revised: 11/21/2017 Document Reviewed: 11/21/2017 Elsevier Patient Education  2020 Elsevier Inc.   

## 2020-09-25 NOTE — Progress Notes (Signed)
Annual Exam

## 2020-12-10 ENCOUNTER — Other Ambulatory Visit (HOSPITAL_COMMUNITY): Payer: Self-pay | Admitting: Internal Medicine

## 2020-12-10 ENCOUNTER — Other Ambulatory Visit: Payer: Self-pay | Admitting: Internal Medicine

## 2020-12-10 DIAGNOSIS — N200 Calculus of kidney: Secondary | ICD-10-CM

## 2020-12-16 ENCOUNTER — Ambulatory Visit
Admission: RE | Admit: 2020-12-16 | Discharge: 2020-12-16 | Disposition: A | Discharge status: Home / Self Care | Payer: BC Managed Care – PPO | Source: Ambulatory Visit | Attending: Obstetrics and Gynecology | Admitting: Obstetrics and Gynecology

## 2020-12-16 ENCOUNTER — Ambulatory Visit
Admission: RE | Admit: 2020-12-16 | Discharge: 2020-12-16 | Disposition: A | Discharge status: Home / Self Care | Payer: BC Managed Care – PPO | Source: Ambulatory Visit | Attending: Internal Medicine | Admitting: Internal Medicine

## 2020-12-16 ENCOUNTER — Other Ambulatory Visit: Payer: Self-pay

## 2020-12-16 DIAGNOSIS — Z1231 Encounter for screening mammogram for malignant neoplasm of breast: Secondary | ICD-10-CM | POA: Diagnosis present

## 2020-12-16 DIAGNOSIS — N200 Calculus of kidney: Secondary | ICD-10-CM | POA: Insufficient documentation

## 2021-04-28 IMAGING — CT CT RENAL STONE PROTOCOL
1 of 2 series · 15 of 32 positions shown, 19 images · non-contrast
Comparison: CT abdomen pelvis dated 06/20/2011.

CLINICAL DATA: 59-year-old female with left flank pain. Concern for
kidney stone.

EXAM:
CT ABDOMEN AND PELVIS WITHOUT CONTRAST
TECHNIQUE: Multidetector CT imaging of the abdomen and pelvis was performed
following the standard protocol without IV contrast.

[Series 2: axial st · axial · 0.67mm/px · z∈[-918,-508]mm · 15 of 90 slices shown, 19 images]
[im 4/90  soft-tissue]
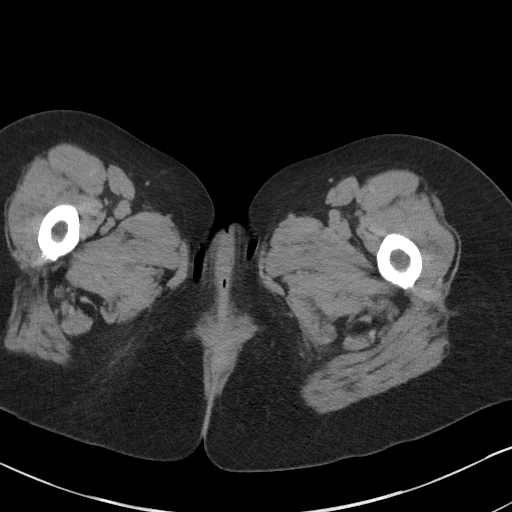
[im 4/90  bone]
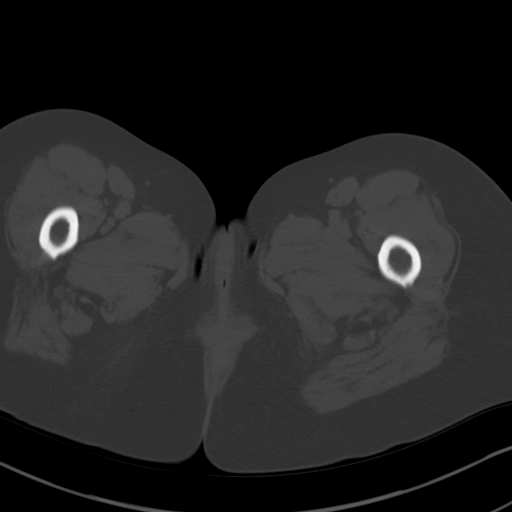
[im 12/90  soft-tissue]
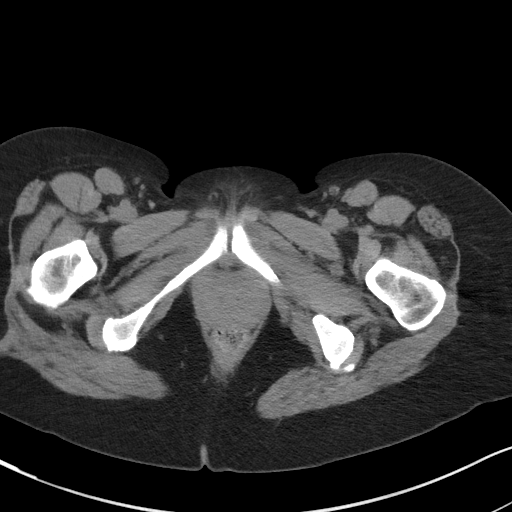
[im 19/90  soft-tissue]
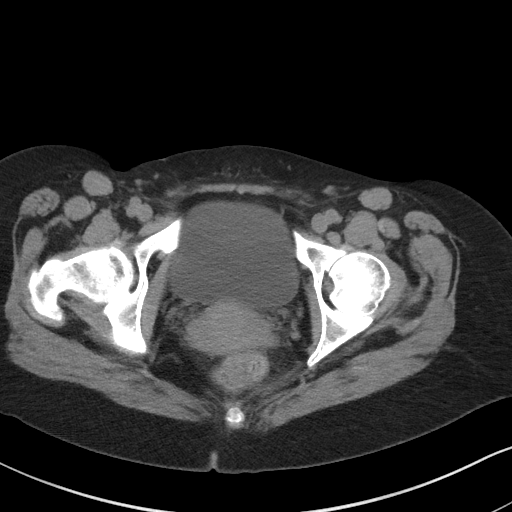
[im 26/90  soft-tissue]
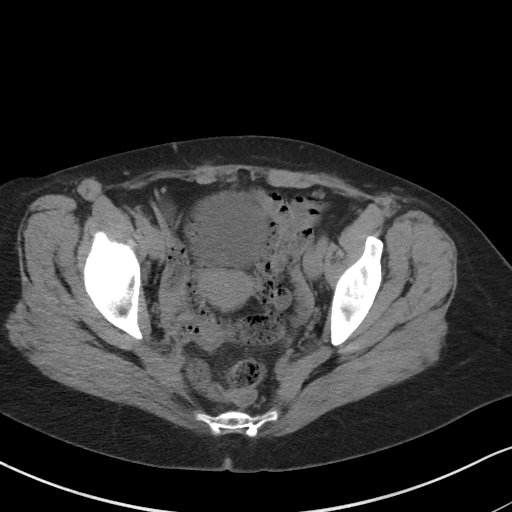
[im 30/90  soft-tissue]
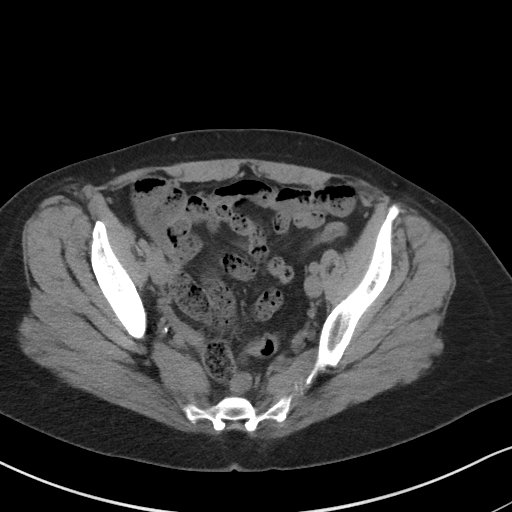
[im 38/90  soft-tissue]
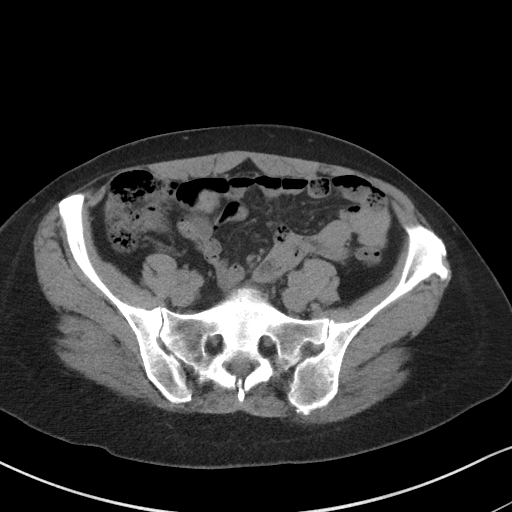
[im 45/90  soft-tissue]
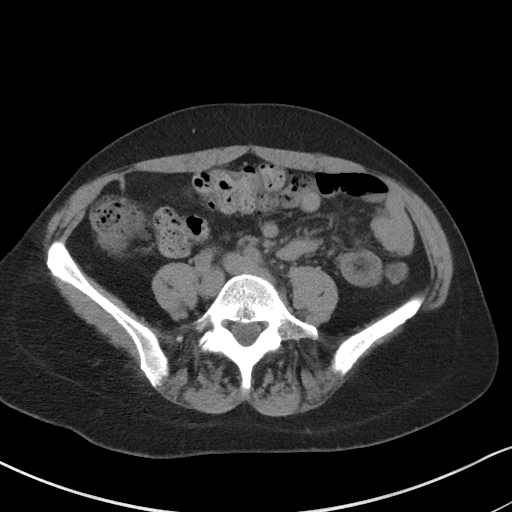
[im 52/90  soft-tissue]
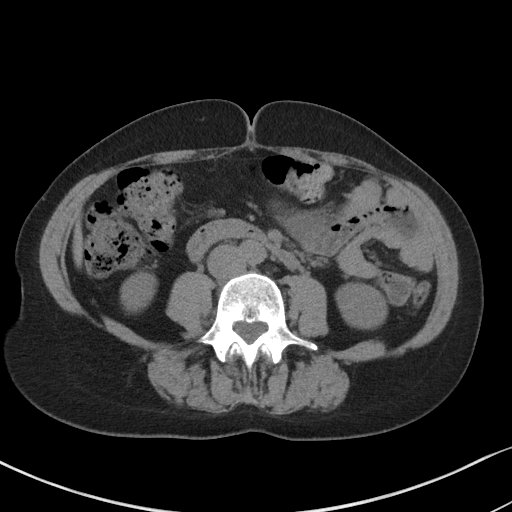
[im 60/90  soft-tissue]
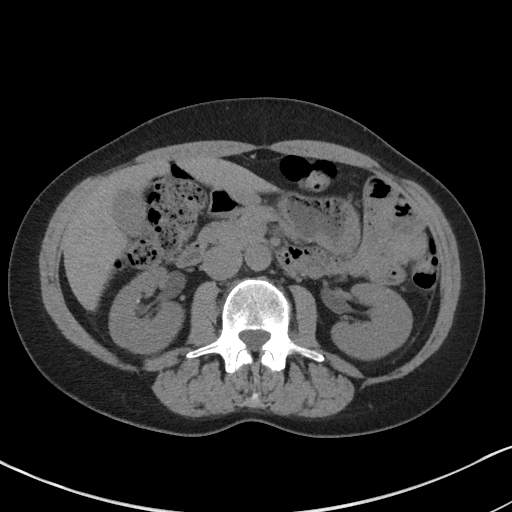
[im 60/90  bone]
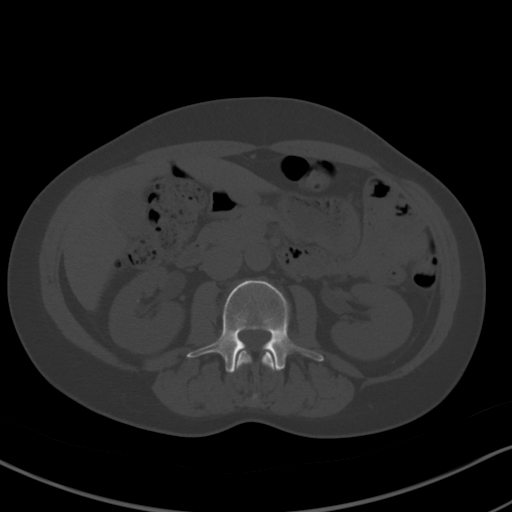
[im 64/90  soft-tissue]
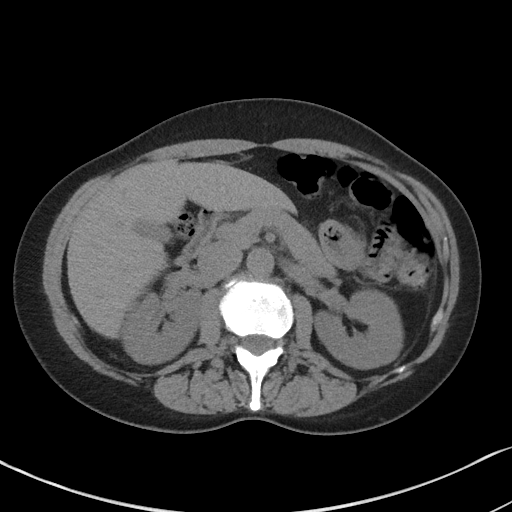
[im 71/90  soft-tissue]
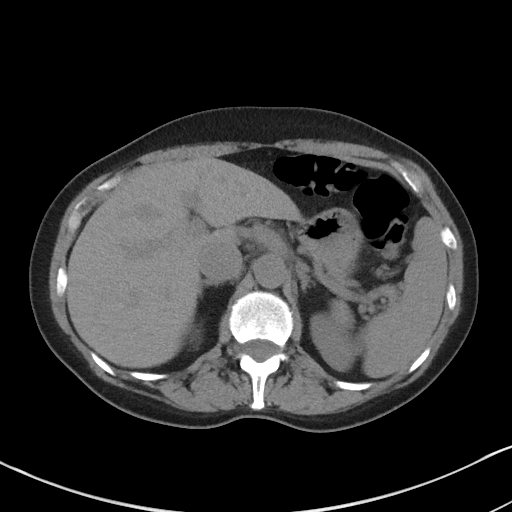
[im 75/90  lung]
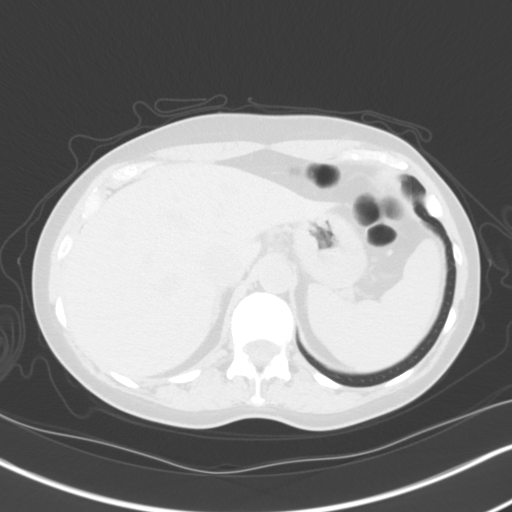
[im 78/90  soft-tissue]
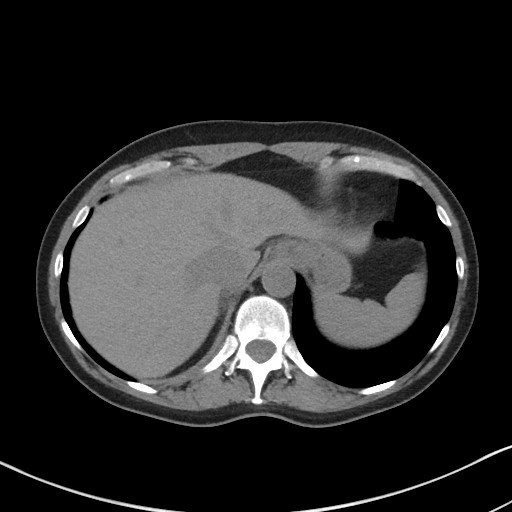
[im 78/90  lung]
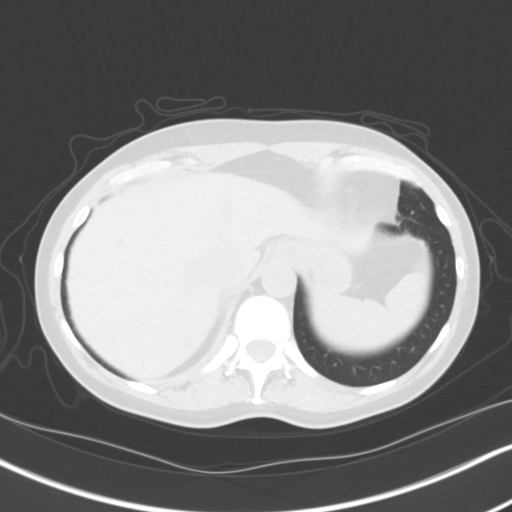
[im 82/90  lung]
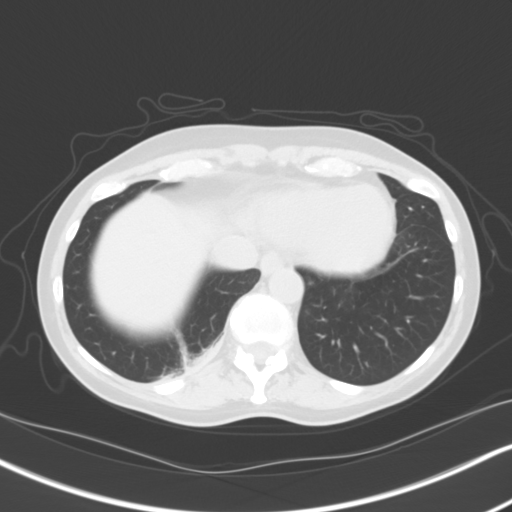
[im 86/90  soft-tissue]
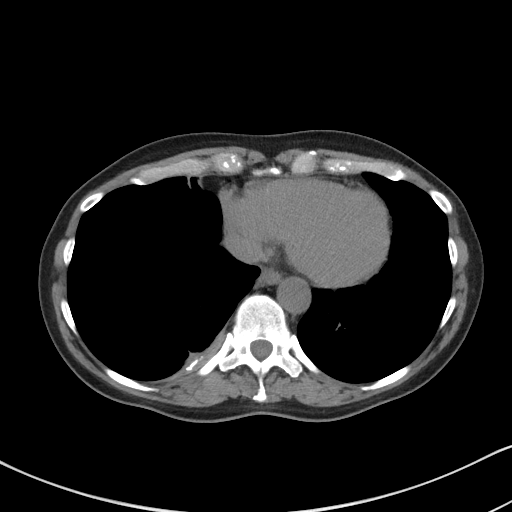
[im 86/90  lung]
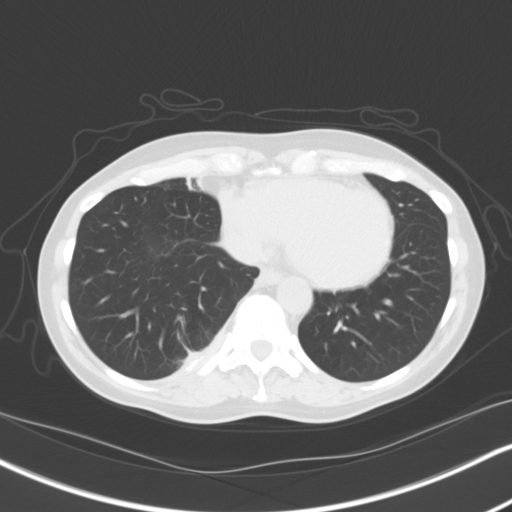

[15 of 32 positions shown; findings below may reference images not displayed]

FINDINGS: Evaluation of this exam is limited in the absence of intravenous
contrast.

Lower chest: Partially visualized chronic right lung base posterior
pleural thickening. The visualized lung bases are otherwise clear.

No intra-abdominal free air or free fluid.

Hepatobiliary: The liver is unremarkable. No intrahepatic biliary
ductal dilatation. The gallbladder is unremarkable.

Pancreas: Unremarkable. No pancreatic ductal dilatation or
surrounding inflammatory changes.

Spleen: Normal in size without focal abnormality.

Adrenals/Urinary Tract: The adrenal glands unremarkable. Punctate
nonobstructing bilateral renal inter pole calculi. No
hydronephrosis. The visualized ureters and urinary bladder are
unremarkable.

Stomach/Bowel: There is moderate stool throughout the colon.
Multiple normal caliber fecalized loops of distal small bowel may
represent increased transit time or small intestinal bacterial
overgrowth. There is no bowel obstruction or active inflammation.
The appendix is normal.

Vascular/Lymphatic: Mild atherosclerotic calcification of the
abdominal aorta. The IVC is unremarkable no portal venous gas. There
is no adenopathy.

Reproductive: The uterus is anteverted.  No adnexal masses.

Other: None

Musculoskeletal: No acute osseous pathology.
IMPRESSION: 1. Punctate nonobstructing bilateral renal inter pole calculi. No
hydronephrosis or obstructing stone.
2. Moderate colonic stool burden. No bowel obstruction. Normal
appendix.
3. Aortic Atherosclerosis ([id number]).

## 2021-04-28 IMAGING — MG MM DIGITAL SCREENING BILAT W/ TOMO AND CAD
8 series · 8 of 24 positions shown · non-contrast
Comparison: Previous exam(s).

CLINICAL DATA: Screening.

EXAM:
DIGITAL SCREENING BILATERAL MAMMOGRAM WITH TOMOSYNTHESIS AND CAD
TECHNIQUE: Bilateral screening digital craniocaudal and mediolateral oblique
mammograms were obtained. Bilateral screening digital breast
tomosynthesis was performed. The images were evaluated with
computer-aided detection.

[R MLO synth-2D]
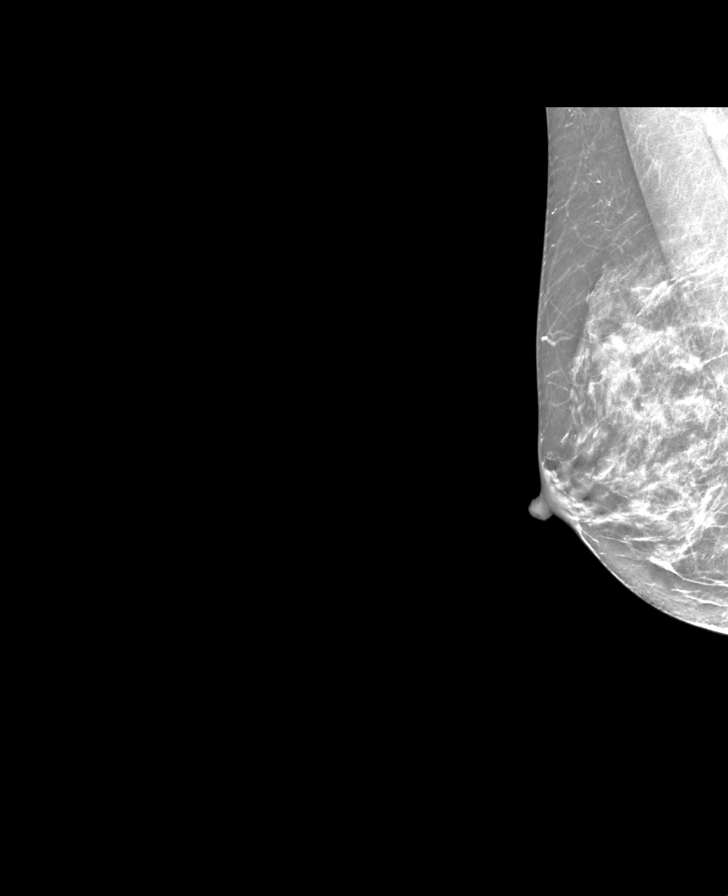

[L MLO synth-2D]
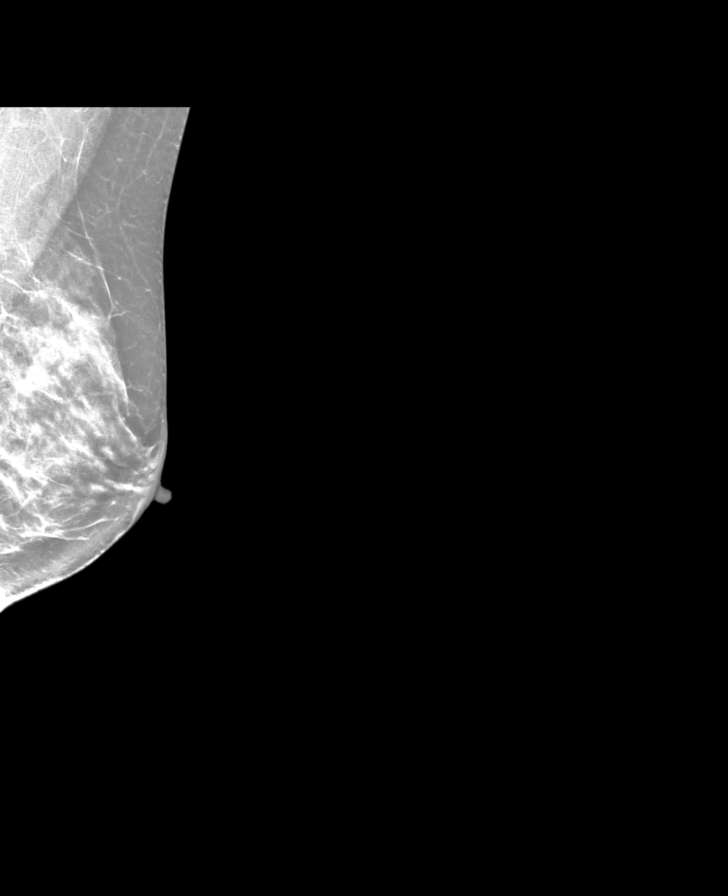

[R CC synth-2D]
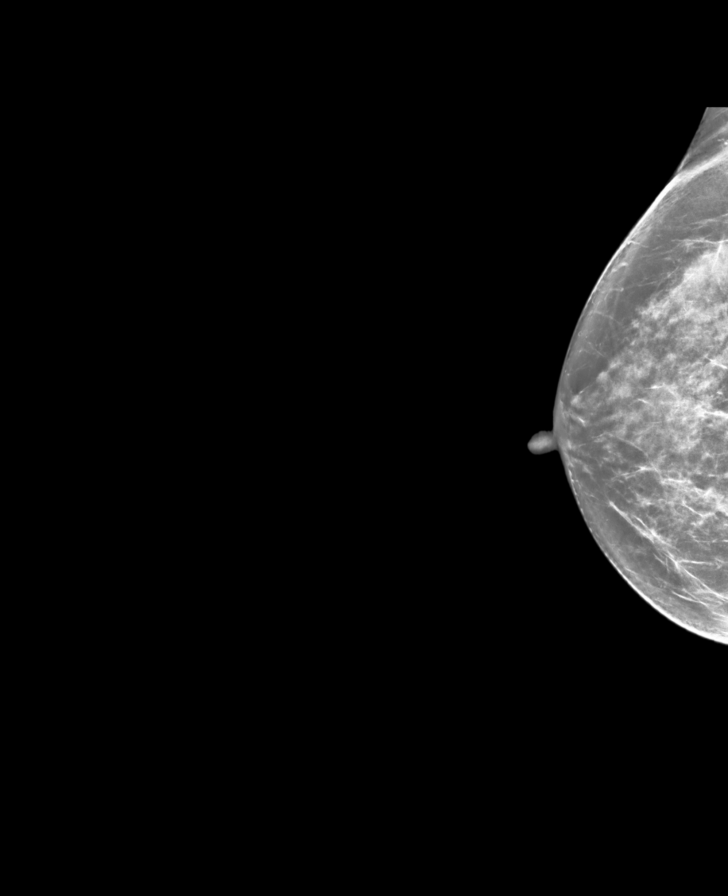

[L CC synth-2D]
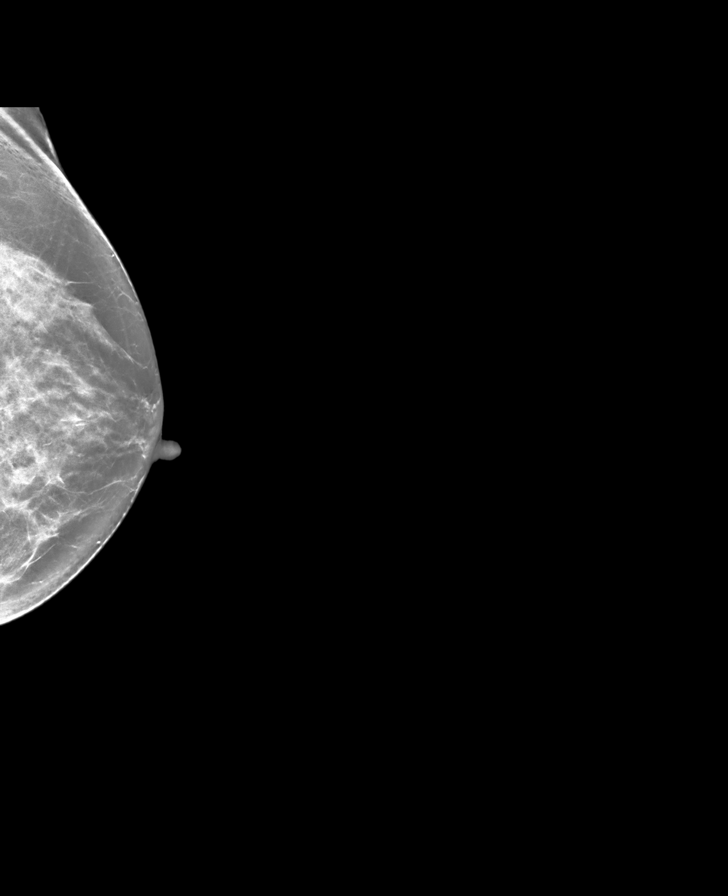

[L MLO tomo · tomo slice 35/68.0]
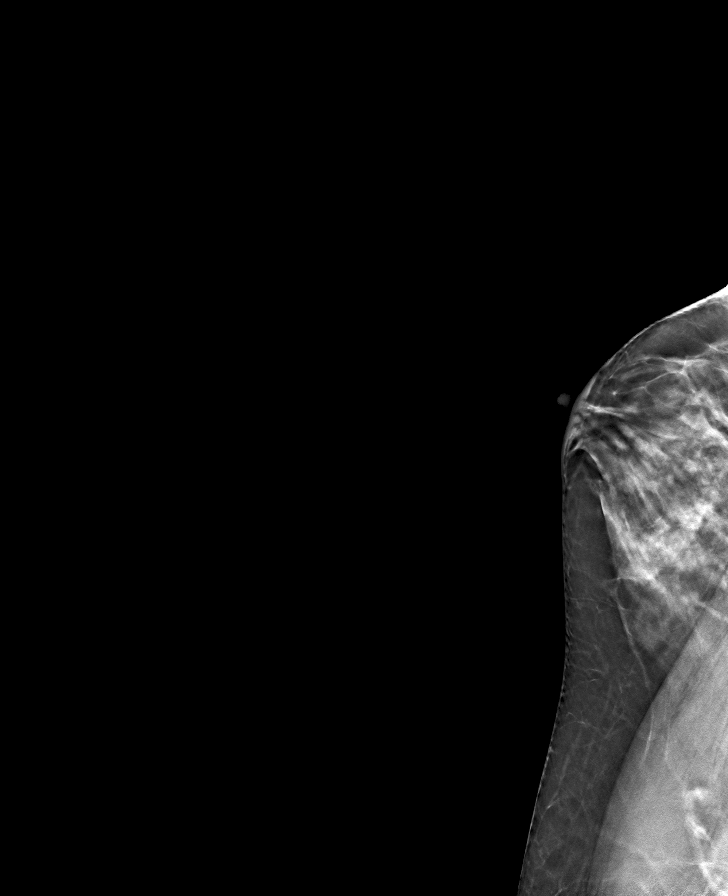

[R CC tomo · tomo slice 34/67.0]
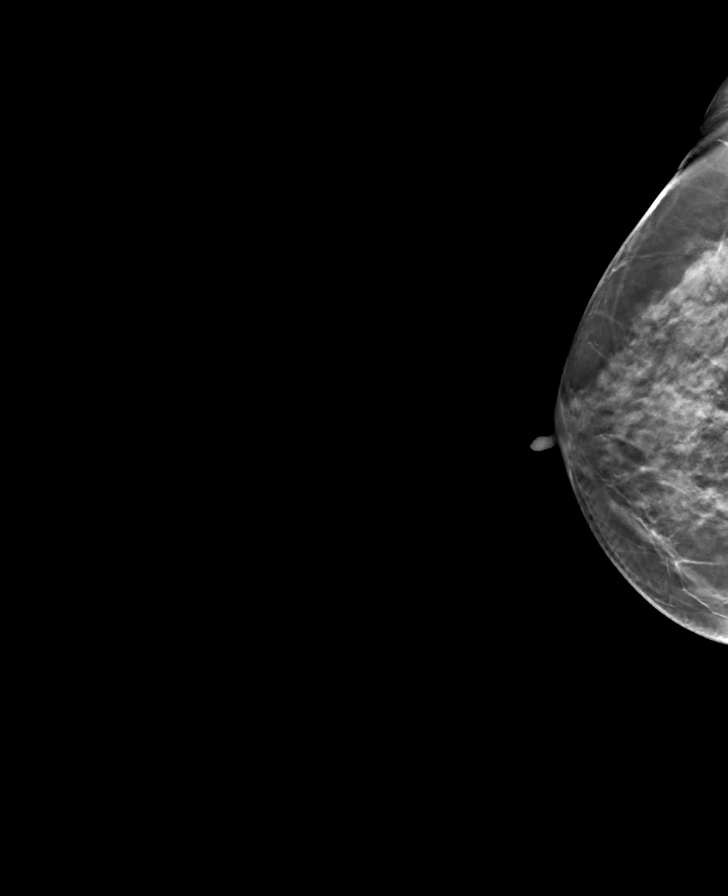

[L CC tomo · tomo slice 34/67.0]
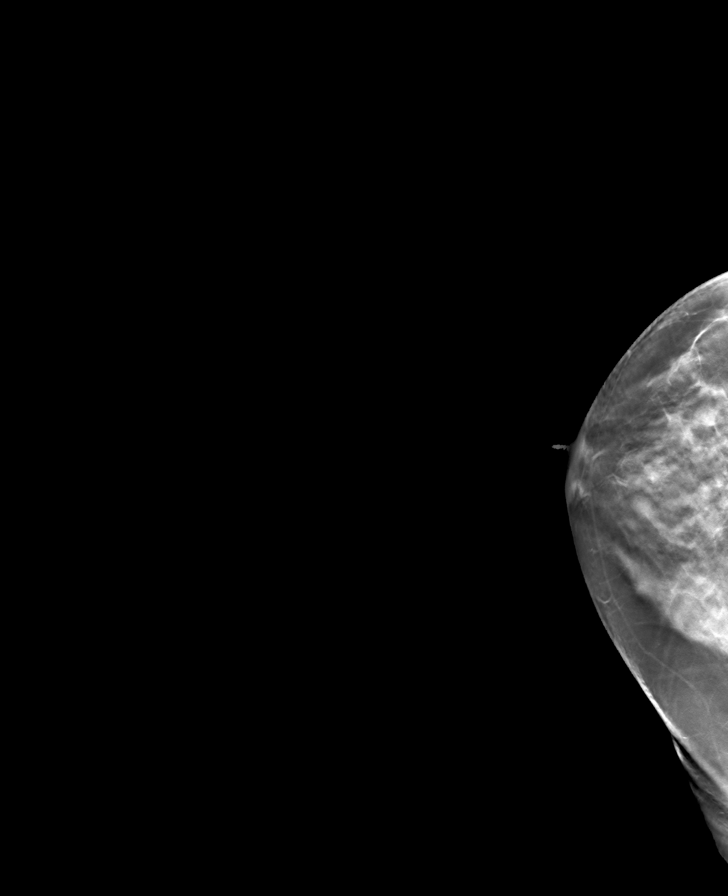

[R MLO tomo · tomo slice 33/66.0]
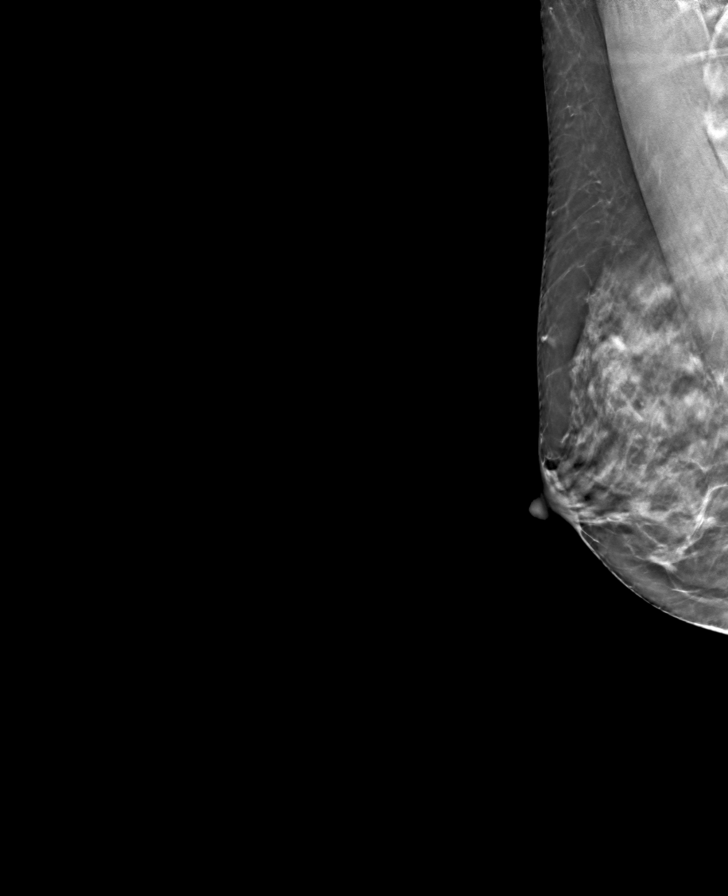

[8 of 24 positions shown; findings below may reference images not displayed]

ACR Breast Density Category c: The breast tissue is heterogeneously
dense, which may obscure small masses.
FINDINGS: There are no findings suspicious for malignancy.
IMPRESSION: No mammographic evidence of malignancy. A result letter of this
screening mammogram will be mailed directly to the patient.

RECOMMENDATION:
Screening mammogram in one year. (Code:[id number])

BI-RADS CATEGORY  1: Negative.

## 2021-07-02 ENCOUNTER — Ambulatory Visit: Payer: BC Managed Care – PPO | Admitting: Dermatology

## 2021-09-03 ENCOUNTER — Other Ambulatory Visit: Payer: Self-pay | Admitting: Obstetrics and Gynecology

## 2021-09-03 DIAGNOSIS — N941 Unspecified dyspareunia: Secondary | ICD-10-CM

## 2021-09-03 DIAGNOSIS — N952 Postmenopausal atrophic vaginitis: Secondary | ICD-10-CM

## 2021-10-03 ENCOUNTER — Other Ambulatory Visit: Payer: Self-pay | Admitting: Obstetrics and Gynecology

## 2021-10-03 DIAGNOSIS — N951 Menopausal and female climacteric states: Secondary | ICD-10-CM

## 2021-10-05 ENCOUNTER — Encounter: Payer: Self-pay | Admitting: Obstetrics and Gynecology

## 2021-10-05 ENCOUNTER — Ambulatory Visit (INDEPENDENT_AMBULATORY_CARE_PROVIDER_SITE_OTHER): Payer: BC Managed Care – PPO | Admitting: Obstetrics and Gynecology

## 2021-10-05 ENCOUNTER — Other Ambulatory Visit: Payer: Self-pay

## 2021-10-05 VITALS — BP 100/68 | Ht 63.0 in | Wt 137.8 lb

## 2021-10-05 DIAGNOSIS — N951 Menopausal and female climacteric states: Secondary | ICD-10-CM | POA: Diagnosis not present

## 2021-10-05 DIAGNOSIS — N941 Unspecified dyspareunia: Secondary | ICD-10-CM

## 2021-10-05 DIAGNOSIS — Z1231 Encounter for screening mammogram for malignant neoplasm of breast: Secondary | ICD-10-CM | POA: Diagnosis not present

## 2021-10-05 DIAGNOSIS — N952 Postmenopausal atrophic vaginitis: Secondary | ICD-10-CM | POA: Diagnosis not present

## 2021-10-05 DIAGNOSIS — Z01419 Encounter for gynecological examination (general) (routine) without abnormal findings: Secondary | ICD-10-CM

## 2021-10-05 MED ORDER — IMVEXXY MAINTENANCE PACK 4 MCG VA INST
4.0000 ug | VAGINAL_INSERT | VAGINAL | 3 refills | Status: DC
Start: 2021-10-05 — End: 2021-12-25

## 2021-10-05 MED ORDER — PREMPRO 0.3-1.5 MG PO TABS: 1.0000 | ORAL_TABLET | Freq: Every day | ORAL | 12 refills | Status: DC

## 2021-10-05 NOTE — Patient Instructions (Signed)
[employer] Recommended Dietary Allowances for Calcium and Vitamin D  Age (yr) Calcium Recommended Dietary Allowance (mg/day) Vitamin D Recommended Dietary Allowance (international units/day)  9-18 1,300 600  19-50 1,000 600  51-70 1,200 600  71 and older 1,200 800  Data from [employer]. Dietary reference intakes: calcium, vitamin D. [place], [place]: National Academies [employer]; 2011.   Exercising to Stay Healthy To become healthy and stay healthy, it is recommended that you do moderate-intensity and vigorous-intensity exercise. You can tell that you are exercising at a moderate intensity if your heart starts beating faster and you start breathing faster but can still hold a conversation. You can tell that you are exercising at a vigorous intensity if you are breathing much harder and faster and cannot hold a conversation while exercising. How can exercise benefit me? Exercising regularly is important. It has many health benefits, such as: Improving overall fitness, flexibility, and endurance. Increasing bone density. Helping with weight control. Decreasing body fat. Increasing muscle strength and endurance. Reducing stress and tension, anxiety, depression, or anger. Improving overall health. What guidelines should I follow while exercising? Before you start a new exercise program, talk with your health care provider. Do not exercise so much that you hurt yourself, feel dizzy, or get very short of breath. Wear comfortable clothes and wear shoes with good support. Drink plenty of water while you exercise to prevent dehydration or heat stroke. Work out until your breathing and your heartbeat get faster (moderate intensity). How often should I exercise? Choose an activity that you enjoy, and set realistic goals. Your health care provider can help you make an activity plan that is individually designed and works best for you. Exercise regularly as told by your health  care provider. This may include: Doing strength training two times a week, such as: Lifting weights. Using resistance bands. Push-ups. Sit-ups. Yoga. Doing a certain intensity of exercise for a given amount of time. Choose from these options: A total of 150 minutes of moderate-intensity exercise every week. A total of 75 minutes of vigorous-intensity exercise every week. A mix of moderate-intensity and vigorous-intensity exercise every week. Children, pregnant women, people who have not exercised regularly, people who are overweight, and older adults may need to talk with a health care provider about what activities are safe to perform. If you have a medical condition, be sure to talk with your health care provider before you start a new exercise program. What are some exercise ideas? Moderate-intensity exercise ideas include: Walking 1 mile (1.6 km) in about 15 minutes. Biking. Hiking. Golfing. Dancing. Water aerobics. Vigorous-intensity exercise ideas include: Walking 4.5 miles (7.2 km) or more in about 1 hour. Jogging or running 5 miles (8 km) in about 1 hour. Biking 10 miles (16.1 km) or more in about 1 hour. Lap swimming. Roller-skating or in-line skating. Cross-country skiing. Vigorous competitive sports, such as football, basketball, and soccer. Jumping rope. Aerobic dancing. What are some everyday activities that can help me get exercise? Yard work, such as: Pushing a lawn mower. Raking and bagging leaves. Washing your car. Pushing a stroller. Shoveling snow. Gardening. Washing windows or floors. How can I be more active in my day-to-day activities? Use stairs instead of an elevator. Take a walk during your lunch break. If you drive, park your car farther away from your work or school. If you take public transportation, get off one stop early and walk the rest of the way. Stand up or walk around during all of   your indoor phone calls. Get up, stretch, and walk  around every 30 minutes throughout the day. Enjoy exercise with a friend. Support to continue exercising will help you keep a regular routine of activity. Where to find more information You can find more information about exercising to stay healthy from: [place] Department of Health and Human Services: www.hhs.gov Centers for Disease Control and Prevention (CDC): www.cdc.gov Summary Exercising regularly is important. It will improve your overall fitness, flexibility, and endurance. Regular exercise will also improve your overall health. It can help you control your weight, reduce stress, and improve your bone density. Do not exercise so much that you hurt yourself, feel dizzy, or get very short of breath. Before you start a new exercise program, talk with your health care provider. This information is not intended to replace advice given to you by your health care provider. Make sure you discuss any questions you have with your health care provider. Document Revised: 02/20/2021 Document Reviewed: 02/20/2021 Elsevier Patient Education  2022 [employer]. Budget-Friendly Healthy Eating There are many ways to save money at the grocery store and continue to eat healthy. You can be successful if you: Plan meals according to your budget. Make a grocery list and only purchase food according to your grocery list. Prepare food yourself at home. What are tips for following this plan? Reading food labels Compare food labels between brand name foods and the store brand. Often the nutritional value is the same, but the store brand is lower cost. Look for products that do not have added sugar, fat, or salt (sodium). These often cost the same but are healthier for you. Products may be labeled as: Sugar-free. Nonfat. Low-fat. Sodium-free. Low-sodium. Look for lean ground beef labeled as at least 92% lean and 8% fat. Shopping  Buy only the items on your grocery list and go only to the areas of the store  that have the items on your list. Use coupons only for foods and brands you normally buy. Avoid buying items you wouldn't normally buy simply because they are on sale. Check online and in newspapers for weekly deals. Buy healthy items from the bulk bins when available, such as herbs, spices, flour, pasta, nuts, and dried fruit. Buy fruits and vegetables that are in season. Prices are usually lower on in-season produce. Look at the unit price on the price tag. Use it to compare different brands and sizes to find out which item is the best deal. Choose healthy items that are often low-cost, such as carrots, potatoes, apples, bananas, and oranges. Dried or canned beans are a low-cost protein source. Buy in bulk and freeze extra food. Items you can buy in bulk include meats, fish, poultry, frozen fruits, and frozen vegetables. Avoid buying "ready-to-eat" foods, such as pre-cut fruits and vegetables and pre-made salads. If possible, shop around to discover where you can find the best prices. Consider other retailers such as dollar stores, larger wholesale stores, local fruit and vegetable stands, and farmers markets. Do not shop when you are hungry. If you shop while hungry, it may be hard to stick to your list and budget. Resist impulse buying. Use your grocery list as your official plan for the week. Buy a variety of vegetables and fruits by purchasing fresh, frozen, and canned items. Look at the top and bottom shelves for deals. Foods at eye level (eye level of an adult or child) are usually more expensive. Be efficient with your time when shopping. The more time you   spend at the store, the more money you are likely to spend. To save money when choosing more expensive foods like meats and dairy: Choose cheaper cuts of meat, such as bone-in chicken thighs and drumsticks instead of skinless and boneless chicken. When you are ready to prepare the chicken, you can remove the skin yourself to make it  healthier. Choose lean meats like chicken or turkey instead of beef. Choose canned seafood, such as tuna, salmon, or sardines. Buy eggs as a low-cost source of protein. Buy dried beans and peas, such as lentils, split peas, or kidney beans instead of meats. Dried beans and peas are a good alternative source of protein. Buy the larger tubs of yogurt instead of individual-sized containers. Choose water instead of sodas and other sweetened beverages. Avoid buying chips, cookies, and other "junk food." These items are usually expensive and not healthy. Cooking Make extra food and freeze the extras in meal-sized containers or in individual portions for fast meals and snacks. Pre-cook on days when you have extra time to prepare meals in advance. You can keep these meals in the fridge or freezer and reheat for a quick meal. When you come home from the grocery store, wash, peel, and cut fruits and vegetables so they are ready to use and eat. This will help reduce food waste. Meal planning Do not eat out or get fast food. Prepare food at home. Make a grocery list and make sure to bring it with you to the store. If you have a smart phone, you could use your phone to create your shopping list. Plan meals and snacks according to a grocery list and budget you create. Use leftovers in your meal plan for the week. Look for recipes where you can cook once and make enough food for two meals. Prepare budget-friendly types of meals like stews, casseroles, and stir-fry dishes. Try some meatless meals or try "no cook" meals like salads. Make sure that half your plate is filled with fruits or vegetables. Choose from fresh, frozen, or canned fruits and vegetables. If eating canned, remember to rinse them before eating. This will remove any excess salt added for packaging. Summary Eating healthy on a budget is possible if you plan your meals according to your budget, purchase according to your budget and grocery list,  and prepare food yourself. Tips for buying more food on a limited budget include buying generic brands, using coupons only for foods you normally buy, and buying healthy items from the bulk bins when available. Tips for buying cheaper food to replace expensive food include choosing cheaper, lean cuts of meat, and buying dried beans and peas. This information is not intended to replace advice given to you by your health care provider. Make sure you discuss any questions you have with your health care provider. Document Revised: 08/07/2020 Document Reviewed: 08/07/2020 Elsevier Patient Education  2022 [employer]. Bone Health Bones protect organs, store calcium, anchor muscles, and support the whole body. Keeping your bones strong is important, especially as you get older. You can take actions to help keep your bones strong and healthy. Why is keeping my bones healthy important? Keeping your bones healthy is important because your body constantly replaces bone cells. Cells get old, and new cells take their place. As we age, we lose bone cells because the body may not be able to make enough new cells to replace the old cells. The amount of bone cells and bone tissue you have is referred to as   bone mass. The higher your bone mass, the stronger your bones. The aging process leads to an overall loss of bone mass in the body, which can increase the likelihood of: Broken bones. A condition in which the bones become weak and brittle (osteoporosis). A large decline in bone mass occurs in older adults. In women, it occurs about the time of menopause. What actions can I take to keep my bones healthy? Good health habits are important for maintaining healthy bones. This includes eating nutritious foods and exercising regularly. To have healthy bones, you need to get enough of the right minerals and vitamins. Most nutrition experts recommend getting these nutrients from the foods that you eat. In some cases,  taking supplements may also be recommended. Doing certain types of exercise is also important for bone health. What are the nutritional recommendations for healthy bones? Eating a well-balanced diet with plenty of calcium and vitamin D will help to protect your bones. Nutritional recommendations vary from person to person. Ask your health care provider what is healthy for you. Here are some general guidelines. Get enough calcium Calcium is the most important (essential) mineral for bone health. Most people can get enough calcium from their diet, but supplements may be recommended for people who are at risk for osteoporosis. Good sources of calcium include: Dairy products, such as low-fat or nonfat milk, cheese, and yogurt. Dark green leafy vegetables, such as bok choy and broccoli. Foods that have calcium added to them (are fortified). Foods that may be fortified with calcium include orange juice, cereal, bread, soy beverages, and tofu products. Nuts, such as almonds. Follow these recommended amounts for daily calcium intake: Infants, 0-6 months: 200 mg. Infants, 6-12 months: 260 mg. Children, age 1-3: 700 mg. Children, age 4-8: 1,000 mg. Children, age 9-13: 1,300 mg. Teens, age 14-18: 1,300 mg. Adults, age 19-50: 1,000 mg. Adults, age 51-70: Men: 1,000 mg. Women: 1,200 mg. Adults, age 71 or older: 1,200 mg. Pregnant and breastfeeding females: Teens: 1,300 mg. Adults: 1,000 mg. Get enough vitamin D Vitamin D is the most essential vitamin for bone health. It helps the body absorb calcium. Sunlight stimulates the skin to make vitamin D, so be sure to get enough sunlight. If you live in a cold climate or you do not get outside often, your health care provider may recommend that you take vitamin D supplements. Good sources of vitamin D in your diet include: Egg yolks. Saltwater fish. Milk and cereal fortified with vitamin D. Follow these recommended amounts for daily vitamin D  intake: Infants, 0-12 months: 400 international units (IU). Children and teens, age 1-18: 600 international units. Adults, age 59 or younger: 600 international units. Adults, age 60 or older: 600-1,000 international units. Get other important nutrients Other nutrients that are important for bone health include: Phosphorus. This mineral is found in meat, poultry, dairy foods, nuts, and legumes. The recommended daily intake for adult men and adult women is 700 mg. Magnesium. This mineral is found in seeds, nuts, dark green vegetables, and legumes. The recommended daily intake for adult men is 400-420 mg. For adult women, it is 310-320 mg. Vitamin K. This vitamin is found in green leafy vegetables. The recommended daily intake is 120 mcg for adult men and 90 mcg for adult women. What type of physical activity is best for building and maintaining healthy bones? Weight-bearing and strength-building activities are important for building and maintaining healthy bones. Weight-bearing activities cause muscles and bones to work against gravity. Strength-building activities   increase the strength of the muscles that support bones. Weight-bearing and muscle-building activities include: Walking and hiking. Jogging and running. Dancing. Gym exercises. Lifting weights. Tennis and racquetball. Climbing stairs. Aerobics. Adults should get at least 30 minutes of moderate physical activity on most days. Children should get at least 60 minutes of moderate physical activity on most days. Ask your health care provider what type of exercise is best for you. How can I find out if my bone mass is low? Bone mass can be measured with an X-ray test called a bone mineral density (BMD) test. This test is recommended for all women who are age 65 or older. It may also be recommended for: Men who are age 70 or older. People who are at risk for osteoporosis because of: Having a long-term disease that weakens bones, such as  kidney disease or rheumatoid arthritis. Having menopause earlier than normal. Taking medicine that weakens bones, such as steroids, thyroid hormones, or hormone treatment for breast cancer or prostate cancer. Smoking. Drinking three or more alcoholic drinks a day. Being underweight. Sedentary lifestyle. If you find that you have a low bone mass, you may be able to prevent osteoporosis or further bone loss by changing your diet and lifestyle. Where can I find more information? Bone Health & Osteoporosis Foundation: www.nof.org/patients National Institutes of Health: www.bones.nih.gov International Osteoporosis Foundation: www.iofbonehealth.org Summary The aging process leads to an overall loss of bone mass in the body, which can increase the likelihood of broken bones and osteoporosis. Eating a well-balanced diet with plenty of calcium and vitamin D will help to protect your bones. Weight-bearing and strength-building activities are also important for building and maintaining strong bones. Bone mass can be measured with an X-ray test called a bone mineral density (BMD) test. This information is not intended to replace advice given to you by your health care provider. Make sure you discuss any questions you have with your health care provider. Document Revised: 04/08/2021 Document Reviewed: 04/08/2021 Elsevier Patient Education  2022 [employer] Inc.  

## 2021-10-05 NOTE — Progress Notes (Signed)
Gynecology Annual Exam  PCP: Albina Billet, MD  Chief Complaint:  Chief Complaint  Patient presents with   Gynecologic Exam    History of Present Illness: Patient is a 60 y.o. G0P0 presents for annual exam. The patient has no complaints today.   LMP: No LMP recorded. Patient is postmenopausal. She denies postmenopausal bleeding or spotting  The patient is sexually active. She denies dyspareunia.  Postcoital Bleeding: no   The patient does perform self breast exams.  There is no notable family history of breast or ovarian cancer in her family.  The patient has regular exercise: planking, walking, floor exercises, physical work 6 days a week  The patient denies current symptoms of depression.   PHQ-9: 0 GAD-7: 0   Review of Systems: Review of Systems  Constitutional:  Negative for chills, fever, malaise/fatigue and weight loss.  HENT:  Negative for congestion, hearing loss and sinus pain.   Eyes:  Negative for blurred vision and double vision.  Respiratory:  Positive for cough and wheezing. Negative for sputum production and shortness of breath.   Cardiovascular:  Negative for chest pain, palpitations, orthopnea and leg swelling.  Gastrointestinal:  Negative for abdominal pain, constipation, diarrhea, nausea and vomiting.  Genitourinary:  Negative for dysuria, flank pain, frequency, hematuria and urgency.  Musculoskeletal:  Negative for back pain, falls and joint pain.  Skin:  Negative for itching and rash.  Neurological:  Negative for dizziness and headaches.  Psychiatric/Behavioral:  Negative for depression, substance abuse and suicidal ideas. The patient is not nervous/anxious.    Past Medical History:  Past Medical History:  Diagnosis Date   Colon polyp 2014    Past Surgical History:  Past Surgical History:  Procedure Laterality Date   COLONOSCOPY WITH PROPOFOL N/A 10/25/2019   Procedure: COLONOSCOPY WITH PROPOFOL;  Surgeon: Benjamine Sprague, DO;  Location: [facility]  ENDOSCOPY;  Service: General;  Laterality: N/A;   [place]    Gynecologic History:  No LMP recorded. Patient is postmenopausal. Last Pap: Results were: 2020 NIL HPV neg  Last mammogram: 2022  Results were: BI-RAD I  Obstetric History: G0P0  Family History:  Family History  Problem Relation Age of Onset   Breast cancer Cousin     Social History:  Social History   Socioeconomic History   Marital status: Married    Spouse name: Not on file   Number of children: Not on file   Years of education: Not on file   Highest education level: Not on file  Occupational History   Not on file  Tobacco Use   Smoking status: Never   Smokeless tobacco: Never  Vaping Use   Vaping Use: Never used  Substance and Sexual Activity   Alcohol use: Yes   Drug use: No   Sexual activity: Yes    Birth control/protection: None, Post-menopausal  Other Topics Concern   Not on file  Social History Narrative   Not on file   Social Determinants of Health   Financial Resource Strain: Not on file  Food Insecurity: Not on file  Transportation Needs: Not on file  Physical Activity: Not on file  Stress: Not on file  Social Connections: Not on file  Intimate Partner Violence: Not on file    Allergies:  No Known Allergies  Medications: Prior to Admission medications   Medication Sig Start Date End Date Taking? Authorizing Provider  estrogen, conjugated,-medroxyprogesterone (PREMPRO) 0.3-1.5 MG tablet Take 1 tablet by mouth  daily. 09/25/20  Yes Katrell Milhorn, Stefanie Libel, MD  IMVEXXY MAINTENANCE PACK 4 MCG INST PLACE 4 MCG VAGINALLY 2 (TWO) TIMES A WEEK. 09/03/21  Yes Mcarthur Ivins R, MD  clobetasol ointment (TEMOVATE) 4.13 % Apply 1 application topically 2 (two) times daily. Patient not taking: Reported on 09/25/2020 07/02/20   Laurence Ferrari, [place], MD  hydrocortisone 2.5 % cream Apply topically 2 (two) times daily as needed (Rash). Patient not taking: Reported on  09/25/2020 07/02/20   Laurence Ferrari, [place], MD  Ivermectin (SOOLANTRA) 1 % CREA Apply to affected areas on face once daily Patient not taking: Reported on 09/25/2020 07/02/20   Laurence Ferrari, [place], MD  metoCLOPramide (REGLAN) 5 MG tablet Take 1 tablet (5 mg total) by mouth as directed. Take one tablet one hour prior to colon prep and one tablet if needed during the prep Patient not taking: Reported on 09/25/2020 01/11/19   Robert Bellow, MD  mupirocin ointment (BACTROBAN) 2 % Apply 1 application topically 2 (two) times daily. Patient not taking: Reported on 09/25/2020 07/02/20   Laurence Ferrari, [place], MD  polyethylene glycol powder (GLYCOLAX/MIRALAX) powder 255 grams one bottle for colonoscopy prep Patient not taking: Reported on 09/25/2020 01/11/19   Robert Bellow, MD    Physical Exam Vitals: Blood pressure 100/68, height 5\' 3"  (1.6 m), weight 137 lb 12.8 oz (62.5 kg).  Physical Exam Constitutional:      Appearance: She is well-developed.  Genitourinary:     Genitourinary Comments: External: Normal appearing vulva. No lesions noted.   Bimanual examination: Uterus midline, non-tender, normal in size, shape and contour.  No CMT. No adnexal masses. No adnexal tenderness. Pelvis not fixed.  Breast Exam: breast equal without skin changes, nipple discharge, breast lump or enlarged lymph nodes   HENT:     Head: Normocephalic and atraumatic.  Neck:     Thyroid: No thyromegaly.  Cardiovascular:     Rate and Rhythm: Normal rate and regular rhythm.     Heart sounds: Normal heart sounds.  Pulmonary:     Effort: Pulmonary effort is normal.     Breath sounds: Normal breath sounds.  Abdominal:     General: Bowel sounds are normal. There is no distension.     Palpations: Abdomen is soft. There is no mass.  Musculoskeletal:     Cervical back: Neck supple.  Neurological:     Mental Status: She is alert and oriented to person, place, and time.  Skin:    General: Skin is warm and dry.  Psychiatric:         Behavior: Behavior normal.        Thought Content: Thought content normal.        Judgment: Judgment normal.  Vitals reviewed.     Female chaperone present for pelvic and breast  portions of the physical exam  Assessment: 60 y.o. G0P0 routine annual exam  Plan: Problem List Items Addressed This Visit   None Visit Diagnoses     Encounter for annual routine gynecological examination    -  Primary   Breast cancer screening by mammogram       Relevant Orders   MM 3D SCREEN BREAST BILATERAL   Vasomotor symptoms due to menopause       Relevant Medications   estrogen, conjugated,-medroxyprogesterone (PREMPRO) 0.3-1.5 MG tablet   Vaginal atrophy       Relevant Medications   Estradiol (IMVEXXY MAINTENANCE PACK) 4 MCG INST   Dyspareunia in female       Relevant Medications  Estradiol (IMVEXXY MAINTENANCE PACK) 4 MCG INST       1) Mammogram - recommend yearly screening mammogram.  Mammogram  was ordered today  2) STI screening was offered and declined  3) Pap smear is up to date, plan to repeat next year with annual  4) Colonoscopy -- performed in Dec. 2020, 10 year follow up advised.   5) Routine healthcare maintenance including cholesterol, diabetes screening discussed managed by PCP. She would like to plan fasting labs for next year, declines today.   6) Osteoporosis screening - not at increased risk, start at 75 FRAX SCORE Age: 8 Sex: female Weight: 62 kg Height: 160 cm Previous Fracture: No Parent Hip Fracture: No Current Smoking: No Glucocorticoids: No Rheumatoid arthritis: No Secondary osteoporosis: No Alcohol ( 3 or more units a day):  No Femoral Neck BMD (g/cm2): none  RESULT 10 year risk of Major Osteoporotic Fracture: 8.0% 10 year risk of Hip Fracture: 0.8%  7) Would like to continue HRT and Imvexxy at this time. Refills sent.    Adrian Prows MD, [facility], [facility] 10/05/2021 10:05 PM

## 2021-10-22 ENCOUNTER — Other Ambulatory Visit: Payer: Self-pay

## 2021-10-22 ENCOUNTER — Ambulatory Visit (INDEPENDENT_AMBULATORY_CARE_PROVIDER_SITE_OTHER): Payer: BC Managed Care – PPO | Admitting: Dermatology

## 2021-10-22 ENCOUNTER — Encounter: Payer: Self-pay | Admitting: Dermatology

## 2021-10-22 DIAGNOSIS — L719 Rosacea, unspecified: Secondary | ICD-10-CM

## 2021-10-22 MED ORDER — IVERMECTIN 1 % EX CREA: TOPICAL_CREAM | CUTANEOUS | 6 refills | Status: DC

## 2021-10-22 MED ORDER — RHOFADE 1 % EX CREA: TOPICAL_CREAM | CUTANEOUS | 6 refills | Status: DC

## 2021-10-22 NOTE — Progress Notes (Signed)
° °  Follow-Up Visit   Subjective  Christina Neal is a 60 y.o. female who presents for the following: Rosacea (Patient currently using Soolantra QD PRN flares. She would like refills. ).  The following portions of the chart were reviewed this encounter and updated as appropriate:   Tobacco   Allergies   Meds   Problems   Med Hx   Surg Hx   Fam Hx       Review of Systems:  No other skin or systemic complaints except as noted in HPI or Assessment and Plan.  Objective  Well appearing patient in no apparent distress; mood and affect are within normal limits.  A focused examination was performed including the face. Relevant physical exam findings are noted in the Assessment and Plan.  Face Mild background erythema.    Assessment & Plan  Rosacea Face  Chronic condition with duration over one year. Currently well-controlled.  Rosacea is a chronic progressive skin condition usually affecting the face of adults, causing redness and/or acne bumps. It is treatable but not curable. It sometimes affects the eyes (ocular rosacea) as well. It may respond to topical and/or systemic medication and can flare with stress, sun exposure, alcohol, exercise and some foods.  Daily application of broad spectrum spf 30+ sunscreen to face is recommended to reduce flares.  Continue Soolantra QD-BID PRN.   Start Rhofade QAM may repeat mid day or evening if needed.   If better price or prefers combined cream, can start [facility] compounded ivermectin-oxymetazoline daily instead.  She denies ocular symptoms  Oxymetazoline HCl (RHOFADE) 1 % CREA - Face Apply to the face QAM may repeat mid day or evening PRN.  Related Medications Ivermectin (SOOLANTRA) 1 % CREA Apply to affected areas on face once daily  Return in about 1 year (around 10/22/2022) for rosacea follow up .  Luther Redo, CMA, am acting as scribe for Forest Gleason, MD .  Documentation: I have reviewed the above documentation for  accuracy and completeness, and I agree with the above.  Forest Gleason, MD

## 2021-10-22 NOTE — Patient Instructions (Signed)
Instructions for [facility] Medications  One or more of your medications was sent to the [facility] mail order compounding pharmacy. You will receive an email from them and can purchase the medicine through that link. It will then be mailed to your home at the address you confirmed. If for any reason you do not receive an email from them, please check your spam folder. If you still do not find the email, please let us know. [facility] phone number is [phone].  If You Need Anything After Your Visit  If you have any questions or concerns for your doctor, please call our main line at [phone] and press option 4 to reach your doctor's medical assistant. If no one answers, please leave a voicemail as directed and we will return your call as soon as possible. Messages left after 4 pm will be answered the following business day.   You may also send [place] a message via [place]. We typically respond to MyChart messages within 1-2 business days.  For prescription refills, please ask your pharmacy to contact our office. Our fax number is [phone].  If you have an urgent issue when the clinic is closed that cannot wait until the next business day, you can page your doctor at the number below.    Please note that while we do our best to be available for urgent issues outside of office hours, we are not available 24/7.   If you have an urgent issue and are unable to reach [place], you may choose to seek medical care at your doctor's office, retail clinic, urgent care center, or emergency room.  If you have a medical emergency, please immediately call 911 or go to the emergency department.  Pager Numbers  - Dr. Nehemiah Massed: [phone]  - Dr. Laurence Ferrari: [phone]  - Dr. Nicole Kindred: [phone]  In the event of inclement weather, please call our main line at [phone] for an update on the status of any delays or closures.  Dermatology Medication Tips: Please keep the boxes that  topical medications come in in order to help keep track of the instructions about where and how to use these. Pharmacies typically print the medication instructions only on the boxes and not directly on the medication tubes.   If your medication is too expensive, please contact our office at [phone] option 4 or send [place] a message through [place].   We are unable to tell what your co-pay for medications will be in advance as this is different depending on your insurance coverage. However, we may be able to find a substitute medication at lower cost or fill out paperwork to get insurance to cover a needed medication.   If a prior authorization is required to get your medication covered by your insurance company, please allow [place] 1-2 business days to complete this process.  Drug prices often vary depending on where the prescription is filled and some pharmacies may offer cheaper prices.  The website www.goodrx.com contains coupons for medications through different pharmacies. The prices here do not account for what the cost may be with help from insurance (it may be cheaper with your insurance), but the website can give you the price if you did not use any insurance.  - You can print the associated coupon and take it with your prescription to the pharmacy.  - You may also stop by our office during regular business hours and pick up a GoodRx coupon card.  - If you need your prescription sent electronically  to a different pharmacy, notify our office through [facility] or by phone at [phone] option 4.     Si Usted Necesita Algo Despus de Su Visita  Tambin puede enviarnos un mensaje a travs de Pharmacist, community. Por lo general respondemos a los mensajes de MyChart en el transcurso de 1 a 2 das hbiles.  Para renovar recetas, por favor pida a su farmacia que se ponga en contacto con nuestra oficina. Harland Dingwall de fax es [place] [phone].  Si tiene un asunto urgente cuando la clnica  est cerrada y que no puede esperar hasta el siguiente da hbil, puede llamar/localizar a su doctor(a) al nmero que aparece a continuacin.   Por favor, tenga en cuenta que aunque hacemos todo lo posible para estar disponibles para asuntos urgentes fuera del horario de [place], no estamos disponibles las 24 horas del da, los 7 das de la [place].   Si tiene un problema urgente y no puede comunicarse con nosotros, puede optar por buscar atencin mdica  en el consultorio de su doctor(a), en una clnica privada, en un centro de atencin urgente o en una sala de emergencias.  Si tiene Engineering geologist, por favor llame inmediatamente al 911 o vaya a la sala de emergencias.  Nmeros de bper  - Dr. Nehemiah Massed: [phone]  - Dra. Moye: [phone]  - Dra. Nicole Kindred: [phone]  En caso de inclemencias del [place], por favor llame a Johnsie Kindred principal al [phone] para una actualizacin sobre el [place] de cualquier retraso o cierre.  Consejos para la medicacin en dermatologa: Por favor, guarde las cajas en las que vienen los medicamentos de uso tpico para ayudarle a seguir las instrucciones sobre dnde y cmo usarlos. Las farmacias generalmente imprimen las instrucciones del medicamento slo en las cajas y no directamente en los tubos del [place].   Si su medicamento es muy caro, por favor, pngase en contacto con Zigmund Daniel llamando al [phone] y presione la opcin 4 o envenos un mensaje a travs de Pharmacist, community.   No podemos decirle cul ser su copago por los medicamentos por adelantado ya que esto es diferente dependiendo de la cobertura de su seguro. Sin embargo, es posible que podamos encontrar un medicamento sustituto a Electrical engineer un formulario para que el seguro cubra el medicamento que se considera necesario.   Si se requiere una autorizacin previa para que su compaa de seguros [place] su medicamento, por favor permtanos de 1 a 2 das hbiles para  completar este proceso.  Los precios de los medicamentos varan con frecuencia dependiendo del Environmental consultant de dnde se surte la receta y alguna farmacias pueden ofrecer precios ms baratos.  El sitio web www.goodrx.com tiene cupones para medicamentos de Airline pilot. Los precios aqu no tienen en cuenta lo que podra costar con la ayuda del seguro (puede ser ms barato con su seguro), pero el sitio web puede darle el precio si no utiliz Research scientist (physical sciences).  - Puede imprimir el cupn correspondiente y llevarlo con su receta a la farmacia.  - Tambin puede pasar por nuestra oficina durante el horario de atencin regular y Charity fundraiser una tarjeta de cupones de GoodRx.  - Si necesita que su receta se enve electrnicamente a una farmacia diferente, informe a nuestra oficina a travs de MyChart de [place] o por telfono llamando al [phone] y presione la opcin 4.

## 2021-12-25 ENCOUNTER — Other Ambulatory Visit: Payer: Self-pay | Admitting: Obstetrics and Gynecology

## 2021-12-25 DIAGNOSIS — N952 Postmenopausal atrophic vaginitis: Secondary | ICD-10-CM

## 2021-12-25 DIAGNOSIS — N941 Unspecified dyspareunia: Secondary | ICD-10-CM

## 2022-03-17 ENCOUNTER — Ambulatory Visit (INDEPENDENT_AMBULATORY_CARE_PROVIDER_SITE_OTHER): Payer: BC Managed Care – PPO | Admitting: Obstetrics and Gynecology

## 2022-03-17 ENCOUNTER — Encounter: Payer: Self-pay | Admitting: Obstetrics and Gynecology

## 2022-03-17 ENCOUNTER — Other Ambulatory Visit (HOSPITAL_COMMUNITY)
Admission: RE | Admit: 2022-03-17 | Discharge: 2022-03-17 | Disposition: A | Discharge status: Home / Self Care | Payer: BC Managed Care – PPO | Source: Ambulatory Visit | Attending: Obstetrics and Gynecology | Admitting: Obstetrics and Gynecology

## 2022-03-17 VITALS — BP 120/80 | Ht 63.0 in | Wt 135.0 lb

## 2022-03-17 DIAGNOSIS — Z01419 Encounter for gynecological examination (general) (routine) without abnormal findings: Secondary | ICD-10-CM | POA: Insufficient documentation

## 2022-03-17 MED ORDER — NYSTATIN 100000 UNIT/GM EX OINT: 1.0000 "application " | TOPICAL_OINTMENT | Freq: Two times a day (BID) | CUTANEOUS | 1 refills | Status: DC

## 2022-03-17 MED ORDER — NYSTATIN-TRIAMCINOLONE 100000-0.1 UNIT/GM-% EX OINT: 1.0000 "application " | TOPICAL_OINTMENT | Freq: Two times a day (BID) | CUTANEOUS | 0 refills | Status: DC

## 2022-03-17 NOTE — Progress Notes (Signed)
Subjective:  ?  ? Christina Neal is a 61 y.o. female P0 postmenopausal who is here for a comprehensive physical exam. The patient reports presence of a rash in her right groin for several months which has enlarged in size and had become pruritic. She denies any other complaints. She denies any episodes of postmenopausal vaginal bleeding. She denies any pelvic pain. ? ?Past Medical History:  ?Diagnosis Date  ? Colon polyp 2014  ? ?Past Surgical History:  ?Procedure Laterality Date  ? COLONOSCOPY WITH PROPOFOL N/A 10/25/2019  ? Procedure: COLONOSCOPY WITH PROPOFOL;  Surgeon: Benjamine Sprague, DO;  Location: [facility] ENDOSCOPY;  Service: General;  Laterality: N/A;  ? CRANIOTOMY  2000  ? LAPAROSCOPY  1992  ? ?Family History  ?Problem Relation Age of Onset  ? Breast cancer Cousin   ? ? ?Social History  ? ?Socioeconomic History  ? Marital status: Married  ?  Spouse name: Not on file  ? Number of children: Not on file  ? Years of education: Not on file  ? Highest education level: Not on file  ?Occupational History  ? Not on file  ?Tobacco Use  ? Smoking status: Never  ? Smokeless tobacco: Never  ?Vaping Use  ? Vaping Use: Never used  ?Substance and Sexual Activity  ? Alcohol use: Yes  ? Drug use: No  ? Sexual activity: Yes  ?  Birth control/protection: None, Post-menopausal  ?Other Topics Concern  ? Not on file  ?Social History Narrative  ? Not on file  ? ?Social Determinants of Health  ? ?Financial Resource Strain: Not on file  ?Food Insecurity: Not on file  ?Transportation Needs: Not on file  ?Physical Activity: Not on file  ?Stress: Not on file  ?Social Connections: Not on file  ?Intimate Partner Violence: Not on file  ? ?Health Maintenance  ?Topic Date Due  ? HIV Screening  Never done  ? Hepatitis C Screening  Never done  ? TETANUS/TDAP  Never done  ? Zoster Vaccines- Shingrix (1 of 2) Never done  ? COVID-19 Vaccine (3 - Booster for Pfizer series) 04/14/2020  ? INFLUENZA VACCINE  06/08/2022  ? PAP SMEAR-Modifier  09/06/2022   ? MAMMOGRAM  12/16/2022  ? COLONOSCOPY (Pts 45-85yr Insurance coverage will need to be confirmed)  10/24/2029  ? HPV VACCINES  Aged Out  ? ? ?  ? ?Review of Systems ?Pertinent items noted in HPI and remainder of comprehensive ROS otherwise negative.  ? ?Objective:  ?Blood pressure 120/80, height '5\' 3"'$  (1.6 m), weight 135 lb (61.2 kg). ? ? GENERAL: Well-developed, well-nourished female in no acute distress.  ?HEENT: Normocephalic, atraumatic. Sclerae anicteric.  ?NECK: Supple. Normal thyroid.  ?LUNGS: Clear to auscultation bilaterally.  ?HEART: Regular rate and rhythm. ?BREASTS: Symmetric in size. No palpable masses or lymphadenopathy, skin changes, or nipple drainage. ?ABDOMEN: Soft, nontender, nondistended. No organomegaly. ?PELVIC: Normal external female genitalia with erythematous rash in right groin between labia majora and thigh. Vagina is pink and rugated.  Normal discharge. Normal appearing cervix. Uterus is normal in size. No adnexal mass or tenderness. Chaperone present during the pelvic exam ?EXTREMITIES: No cyanosis, clubbing, or edema, 2+ distal pulses. ?  ?  ?Assessment:  ? ? Healthy female exam.    ?  ?Plan:  ? ? Pap smear collected ?Screening mammogram 12/2020 was normal ?Colonoscopy due in 2030 ?Rx Mycology provided ?Patient will be contacted with abnormal results ?See After Visit Summary for Counseling Recommendations  ? ?

## 2022-03-19 LAB — CYTOLOGY - PAP: Diagnosis: NEGATIVE

## 2022-10-27 ENCOUNTER — Ambulatory Visit: Payer: BC Managed Care – PPO | Admitting: Dermatology

## 2022-11-09 ENCOUNTER — Other Ambulatory Visit: Payer: Self-pay

## 2022-11-09 DIAGNOSIS — N951 Menopausal and female climacteric states: Secondary | ICD-10-CM

## 2022-11-09 MED ORDER — PREMPRO 0.3-1.5 MG PO TABS: 1.0000 | ORAL_TABLET | Freq: Every day | ORAL | 0 refills | Status: DC

## 2022-11-11 ENCOUNTER — Telehealth: Payer: Self-pay

## 2022-11-11 NOTE — Telephone Encounter (Signed)
Pt called triage requesting a RF on her Prempro. She had to r/s her annual and she will run out of Prempro before her appt on 12/13/22. Can you send in RF pls?

## 2022-11-12 ENCOUNTER — Other Ambulatory Visit: Payer: Self-pay | Admitting: Obstetrics and Gynecology

## 2022-11-12 DIAGNOSIS — N951 Menopausal and female climacteric states: Secondary | ICD-10-CM

## 2022-11-12 MED ORDER — PREMPRO 0.3-1.5 MG PO TABS: 1.0000 | ORAL_TABLET | Freq: Every day | ORAL | 0 refills | Status: DC

## 2022-11-12 NOTE — Progress Notes (Signed)
Rx RF prempro till 2/24 annual

## 2022-11-12 NOTE — Telephone Encounter (Signed)
Rx RF eRxd.  

## 2022-11-15 NOTE — Telephone Encounter (Signed)
Pt aware.

## 2022-11-25 ENCOUNTER — Other Ambulatory Visit: Payer: Self-pay | Admitting: Obstetrics and Gynecology

## 2022-11-25 DIAGNOSIS — N951 Menopausal and female climacteric states: Secondary | ICD-10-CM

## 2022-11-26 ENCOUNTER — Other Ambulatory Visit: Payer: Self-pay

## 2022-11-26 DIAGNOSIS — N951 Menopausal and female climacteric states: Secondary | ICD-10-CM

## 2022-11-26 MED ORDER — PREMPRO 0.3-1.5 MG PO TABS: 1.0000 | ORAL_TABLET | Freq: Every day | ORAL | 0 refills | Status: DC

## 2022-12-07 ENCOUNTER — Ambulatory Visit: Payer: 59 | Admitting: Obstetrics and Gynecology

## 2022-12-12 NOTE — Progress Notes (Deleted)
PCP: Albina Billet, MD   No chief complaint on file.   HPI:      [name] is a 62 y.o. G0P0 whose LMP was No LMP recorded. Patient is postmenopausal., presents today for her annual examination.  Her menses are {norm/abn:715}, lasting {number: 22536} days.  Dysmenorrhea {dysmen:716}. She {does:18564} have intermenstrual bleeding. She {does:18564} have vasomotor sx.   Sex activity: {sex active: 315163}. She {does:18564} have vaginal dryness.  Last Pap: [id number]  Results were: {norm/abn:16707::"no abnormalities"} /neg HPV DNA.  Hx of STDs: {STD hx:14358}  Last mammogram: {date:304500300}  Results were: {norm/abn:13465} There is no FH of breast cancer. There is no FH of ovarian cancer. The patient {does:18564} do self-breast exams.  Colonoscopy: {hx:15363}  Repeat due after 10*** years.   Tobacco use: {tob:20664} Alcohol use: {Alcohol:11675} No drug use Exercise: {exercise:31265}  She {does:18564} get adequate calcium and Vitamin D in her diet.  Labs with PCP.   Patient Active Problem List   Diagnosis Date Noted   Encounter for screening colonoscopy 04/26/2013    Past Surgical History:  Procedure Laterality Date   COLONOSCOPY WITH PROPOFOL N/A 10/25/2019   Procedure: COLONOSCOPY WITH PROPOFOL;  Surgeon: Benjamine Sprague, DO;  Location: [facility] ENDOSCOPY;  Service: General;  Laterality: N/A;   CRANIOTOMY  2000   LAPAROSCOPY  1992    Family History  Problem Relation Age of Onset   Breast cancer Cousin     Social History   Socioeconomic History   Marital status: Married    Spouse name: Not on file   Number of children: Not on file   Years of education: Not on file   Highest education level: Not on file  Occupational History   Not on file  Tobacco Use   Smoking status: Never   Smokeless tobacco: Never  Vaping Use   Vaping Use: Never used  Substance and Sexual Activity   Alcohol use: Yes   Drug use: No   Sexual activity: Yes    Birth  control/protection: None, Post-menopausal  Other Topics Concern   Not on file  Social History Narrative   Not on file   Social Determinants of Health   Financial Resource Strain: Not on file  Food Insecurity: Not on file  Transportation Needs: Not on file  Physical Activity: Unknown (10/20/2017)   Exercise Vital Sign    Days of Exercise per Week: 7 days    Minutes of Exercise per Session: Not on file  Stress: No Stress Concern Present (10/20/2017)   [employer] [place]    Feeling of Stress : Not at all  Social Connections: Moderately Isolated (10/20/2017)   Social Connection and Isolation Panel [NHANES]    Frequency of Communication with Friends and Family: More than three times a week    Frequency of Social Gatherings with Friends and Family: More than three times a week    Attends Religious Services: Never    Marine scientist or Organizations: No    Attends Archivist Meetings: Never    Marital Status: Separated  Intimate Partner Violence: Not At Risk (10/20/2017)   Humiliation, Afraid, Rape, and Kick questionnaire    Fear of Current or Ex-Partner: No    Emotionally Abused: No    Physically Abused: No    Sexually Abused: No     Current Outpatient Medications:    clobetasol ointment (TEMOVATE) [id number] %, Apply 1 application topically 2 (two) times daily. (Patient not  taking: Reported on 03/17/2022), Disp: 30 g, Rfl: 1   estrogen, conjugated,-medroxyprogesterone (PREMPRO) 0.3-1.5 MG tablet, Take 1 tablet by mouth daily., Disp: 28 tablet, Rfl: 0   hydrocortisone 2.5 % cream, Apply topically 2 (two) times daily as needed (Rash). (Patient not taking: Reported on 03/17/2022), Disp: 30 g, Rfl: 1   IMVEXXY MAINTENANCE PACK 4 MCG INST, PLACE 4 MCG VAGINALLY 2 (TWO) TIMES A WEEK., Disp: 72 each, Rfl: 1   Ivermectin (SOOLANTRA) 1 % CREA, Apply to affected areas on face once daily, Disp: 45 g, Rfl: 6   metoCLOPramide  (REGLAN) 5 MG tablet, Take 1 tablet (5 mg total) by mouth as directed. Take one tablet one hour prior to colon prep and one tablet if needed during the prep (Patient not taking: Reported on 03/17/2022), Disp: 2 tablet, Rfl: 0   mupirocin ointment (BACTROBAN) 2 %, Apply 1 application topically 2 (two) times daily. (Patient not taking: Reported on 03/17/2022), Disp: 22 g, Rfl: 1   nystatin-triamcinolone ointment (MYCOLOG), Apply 1 application. topically 2 (two) times daily., Disp: 30 g, Rfl: 0   Oxymetazoline HCl (RHOFADE) 1 % CREA, Apply to the face QAM may repeat mid day or evening PRN., Disp: 30 g, Rfl: 6   polyethylene glycol powder (GLYCOLAX/MIRALAX) powder, 255 grams one bottle for colonoscopy prep (Patient not taking: Reported on 03/17/2022), Disp: 255 g, Rfl: 0     ROS:  Review of Systems BREAST: No symptoms    Objective: There were no vitals taken for this visit.   OBGyn Exam  Results: No results found for this or any previous visit (from the past 24 hour(s)).  Assessment/Plan:  No diagnosis found.   No orders of the defined types were placed in this encounter.           GYN counsel {counseling: 16159}    F/U  No follow-ups on file.  Lakyra Tippins B. Fordyce Lepak, PA-C 12/12/2022 6:50 PM

## 2022-12-13 ENCOUNTER — Telehealth: Payer: Self-pay

## 2022-12-13 ENCOUNTER — Ambulatory Visit: Payer: 59 | Admitting: Obstetrics and Gynecology

## 2022-12-13 DIAGNOSIS — N952 Postmenopausal atrophic vaginitis: Secondary | ICD-10-CM

## 2022-12-13 DIAGNOSIS — N941 Unspecified dyspareunia: Secondary | ICD-10-CM

## 2022-12-13 DIAGNOSIS — Z1231 Encounter for screening mammogram for malignant neoplasm of breast: Secondary | ICD-10-CM

## 2022-12-13 NOTE — Telephone Encounter (Signed)
Pt aware annual due in 03/2023. She needs RF's for Imvexxy and Prempro. Rx's sent to [facility]. Pt is asking if you can put in a mammo order to get that scheduled?

## 2022-12-14 ENCOUNTER — Other Ambulatory Visit: Payer: Self-pay | Admitting: Obstetrics and Gynecology

## 2022-12-14 DIAGNOSIS — N941 Unspecified dyspareunia: Secondary | ICD-10-CM

## 2022-12-14 DIAGNOSIS — N952 Postmenopausal atrophic vaginitis: Secondary | ICD-10-CM

## 2022-12-14 DIAGNOSIS — N951 Menopausal and female climacteric states: Secondary | ICD-10-CM

## 2022-12-14 MED ORDER — PREMPRO 0.3-1.5 MG PO TABS: 1.0000 | ORAL_TABLET | Freq: Every day | ORAL | 0 refills | Status: DC

## 2022-12-14 MED ORDER — IMVEXXY MAINTENANCE PACK 4 MCG VA INST: 4.0000 ug | VAGINAL_INSERT | VAGINAL | 0 refills | Status: DC

## 2022-12-14 NOTE — Telephone Encounter (Signed)
Mammo order placed. Forgot that. Thx

## 2022-12-14 NOTE — Telephone Encounter (Signed)
Rx RF eRxd. If pt runs out again before appt, will RF again

## 2022-12-14 NOTE — Progress Notes (Signed)
Rx RF imvexxy and prempro till 5/24 annual.

## 2022-12-15 NOTE — Telephone Encounter (Signed)
Pt aware.

## 2023-01-04 ENCOUNTER — Ambulatory Visit
Admission: RE | Admit: 2023-01-04 | Discharge: 2023-01-04 | Disposition: A | Discharge status: Home / Self Care | Payer: 59 | Source: Ambulatory Visit | Attending: Obstetrics and Gynecology | Admitting: Obstetrics and Gynecology

## 2023-01-04 DIAGNOSIS — Z1231 Encounter for screening mammogram for malignant neoplasm of breast: Secondary | ICD-10-CM

## 2023-01-26 ENCOUNTER — Encounter: Payer: Self-pay | Admitting: Dermatology

## 2023-01-26 ENCOUNTER — Ambulatory Visit: Payer: 59 | Admitting: Dermatology

## 2023-01-26 DIAGNOSIS — L719 Rosacea, unspecified: Secondary | ICD-10-CM

## 2023-01-26 DIAGNOSIS — D225 Melanocytic nevi of trunk: Secondary | ICD-10-CM | POA: Diagnosis not present

## 2023-01-26 DIAGNOSIS — L219 Seborrheic dermatitis, unspecified: Secondary | ICD-10-CM | POA: Diagnosis not present

## 2023-01-26 MED ORDER — TRIAMCINOLONE ACETONIDE 0.1 % EX LOTN: TOPICAL_LOTION | CUTANEOUS | 2 refills | Status: DC

## 2023-01-26 MED ORDER — IVERMECTIN 1 % EX CREA: TOPICAL_CREAM | CUTANEOUS | 6 refills | Status: DC

## 2023-01-26 NOTE — Progress Notes (Signed)
Follow-Up Visit   Subjective  Christina Neal is a 62 y.o. female who presents for the following: [place] but only as needed, not having break outs, not bothered by redness so she does not use Rhofade.   Patient has 2 places at scalp, one that does not go away, scabs up. Also with some at ears. Patient said we have given her something in the past, an ointment in a tube with red, possibly clobetasol. She has a spot at back to check that she picks at.   The following portions of the chart were reviewed this encounter and updated as appropriate: medications, allergies, medical history  Review of Systems:  No other skin or systemic complaints except as noted in HPI or Assessment and Plan.  Objective  Well appearing patient in no apparent distress; mood and affect are within normal limits.  Areas Examined: face    Assessment & Plan   ROSACEA Exam Mid face erythema with telangiectasias  She denies grittiness of the eyes  Chronic condition with duration or expected duration over one year. Currently well-controlled.  Rosacea is a chronic progressive skin condition usually affecting the face of adults, causing redness and/or acne bumps. It is treatable but not curable. It sometimes affects the eyes (ocular rosacea) as well. It may respond to topical and/or systemic medication and can flare with stress, sun exposure, alcohol, exercise, topical steroids (including hydrocortisone/cortisone 10) and some foods.  Daily application of broad spectrum spf 30+ sunscreen to face is recommended to reduce flares.  Treatment Plan Continue Soolantra once daily as needed.    MELANOCYTIC NEVI Benign appearing on exam today. Recommend observation. Call clinic for new or changing moles. Recommend daily use of broad spectrum spf 30+ sunscreen to sun-exposed areas.  Exam Tan-brown and/or pink-flesh-colored symmetric macules and papules  - 0.4 cm erythematous papule without features  suspicious for malignancy on dermoscopy at upper mid back   SEBORRHEIC DERMATITIS Exam: Pink patches with greasy scale at scalp, ears  Chronic and persistent condition with duration or expected duration over one year. Condition is bothersome/symptomatic for patient. Currently flared.  Seborrheic Dermatitis is a chronic persistent rash characterized by pinkness and scaling most commonly of the mid face but also can occur on the scalp (dandruff), ears; mid chest, mid back and groin.  It tends to be exacerbated by stress and cooler weather.  People who have neurologic disease may experience new onset or exacerbation of existing seborrheic dermatitis.  The condition is not curable but treatable and can be controlled.  Treatment Plan:  Recommend OTC color safe dandruff shampoo apply three times per week, massage into scalp and leave in for 10 minutes before rinsing out. May follow with regular shampoo and conditioner.   Apply triamcinolone 0.1% lotion twice a day as needed to affected areas at scalp and for up to 2 weeks at ears. Avoid applying to face, groin, and axilla. Use as directed. Long-term use can cause thinning of the skin.  Topical steroids (such as triamcinolone, fluocinolone, fluocinonide, mometasone, clobetasol, halobetasol, betamethasone, hydrocortisone) can cause thinning and lightening of the skin if they are used for too long in the same area. Your physician has selected the right strength medicine for your problem and area affected on the body. Please use your medication only as directed by your physician to prevent side effects.    Return in about 1 year (around 01/26/2024) for Seb Derm, Rosacea.  Graciella Belton, RMA, am acting as scribe for  Forest Gleason, MD .   Documentation: I have reviewed the above documentation for accuracy and completeness, and I agree with the above.  Forest Gleason, MD

## 2023-01-26 NOTE — Patient Instructions (Addendum)
Recommend Ketotifen eye drops for allergies.   Recommend Vitamin D 800iu daily.   Recommend OTC color safe dandruff shampoo apply three times per week, massage into scalp and leave in for 5-10 minutes before rinsing out. May follow with regular shampoo and conditioner.   Apply triamcinolone 0.1% lotion twice a day as needed to affected areas at scalp and for up to 2 weeks at ears. Avoid applying to face, groin, and axilla. Use as directed. Long-term use can cause thinning of the skin.  Topical steroids (such as triamcinolone, fluocinolone, fluocinonide, mometasone, clobetasol, halobetasol, betamethasone, hydrocortisone) can cause thinning and lightening of the skin if they are used for too long in the same area. Your physician has selected the right strength medicine for your problem and area affected on the body. Please use your medication only as directed by your physician to prevent side effects.   Recommend taking Heliocare sun protection supplement daily in sunny weather for additional sun protection. For maximum protection on the sunniest days, you can take up to 2 capsules of regular Heliocare OR take 1 capsule of Heliocare Ultra. For prolonged exposure (such as a full day in the sun), you can repeat your dose of the supplement 4 hours after your first dose. Heliocare can be purchased at [employer], at some [facility] or at [email or web address].    Due to recent changes in healthcare laws, you may see results of your pathology and/or laboratory studies on MyChart before the doctors have had a chance to review them. We understand that in some cases there may be results that are confusing or concerning to you. Please understand that not all results are received at the same time and often the doctors may need to interpret multiple results in order to provide you with the best plan of care or course of treatment. Therefore, we ask that you please give [place] 2 business days to thoroughly review all  your results before contacting the office for clarification. Should we see a critical lab result, you will be contacted sooner.   If You Need Anything After Your Visit  If you have any questions or concerns for your doctor, please call our main line at [phone] and press option 4 to reach your doctor's medical assistant. If no one answers, please leave a voicemail as directed and we will return your call as soon as possible. Messages left after 4 pm will be answered the following business day.   You may also send [place] a message via [place]. We typically respond to MyChart messages within 1-2 business days.  For prescription refills, please ask your pharmacy to contact our office. Our fax number is [phone].  If you have an urgent issue when the clinic is closed that cannot wait until the next business day, you can page your doctor at the number below.    Please note that while we do our best to be available for urgent issues outside of office hours, we are not available 24/7.   If you have an urgent issue and are unable to reach [place], you may choose to seek medical care at your doctor's office, retail clinic, urgent care center, or emergency room.  If you have a medical emergency, please immediately call 911 or go to the emergency department.  Pager Numbers  - Dr. Nehemiah Massed: [phone]  - Dr. Laurence Ferrari: [phone]  - Dr. Nicole Kindred: [phone]  In the event of inclement weather, please call our main line at [phone] for an update on the  status of any delays or closures.  Dermatology Medication Tips: Please keep the boxes that topical medications come in in order to help keep track of the instructions about where and how to use these. Pharmacies typically print the medication instructions only on the boxes and not directly on the medication tubes.   If your medication is too expensive, please contact our office at [phone] option 4 or send [place] a message through [place].   We  are unable to tell what your co-pay for medications will be in advance as this is different depending on your insurance coverage. However, we may be able to find a substitute medication at lower cost or fill out paperwork to get insurance to cover a needed medication.   If a prior authorization is required to get your medication covered by your insurance company, please allow [place] 1-2 business days to complete this process.  Drug prices often vary depending on where the prescription is filled and some pharmacies may offer cheaper prices.  The website www.goodrx.com contains coupons for medications through different pharmacies. The prices here do not account for what the cost may be with help from insurance (it may be cheaper with your insurance), but the website can give you the price if you did not use any insurance.  - You can print the associated coupon and take it with your prescription to the pharmacy.  - You may also stop by our office during regular business hours and pick up a GoodRx coupon card.  - If you need your prescription sent electronically to a different pharmacy, notify our office through [facility] or by phone at [phone] option 4.     Si Usted Necesita Algo Despus de Su Visita  Tambin puede enviarnos un mensaje a travs de Pharmacist, community. Por lo general respondemos a los mensajes de MyChart en el transcurso de 1 a 2 das hbiles.  Para renovar recetas, por favor pida a su farmacia que se ponga en contacto con nuestra oficina. Harland Dingwall de fax es [place] [phone].  Si tiene un asunto urgente cuando la clnica est cerrada y que no puede esperar hasta el siguiente da hbil, puede llamar/localizar a su doctor(a) al nmero que aparece a continuacin.   Por favor, tenga en cuenta que aunque hacemos todo lo posible para estar disponibles para asuntos urgentes fuera del horario de [place], no estamos disponibles las 24 horas del da, los 7 das de la [place].   Si tiene  un problema urgente y no puede comunicarse con nosotros, puede optar por buscar atencin mdica  en el consultorio de su doctor(a), en una clnica privada, en un centro de atencin urgente o en una sala de emergencias.  Si tiene Engineering geologist, por favor llame inmediatamente al 911 o vaya a la sala de emergencias.  Nmeros de bper  - Dr. Nehemiah Massed: [phone]  - Dra. Moye: [phone]  - Dra. Nicole Kindred: [phone]  En caso de inclemencias del [place], por favor llame a Johnsie Kindred principal al [phone] para una actualizacin sobre el [place] de cualquier retraso o cierre.  Consejos para la medicacin en dermatologa: Por favor, guarde las cajas en las que vienen los medicamentos de uso tpico para ayudarle a seguir las instrucciones sobre dnde y cmo usarlos. Las farmacias generalmente imprimen las instrucciones del medicamento slo en las cajas y no directamente en los tubos del [place].   Si su medicamento es muy caro, por favor, pngase en contacto con Zigmund Daniel llamando al [phone] y  presione la opcin 4 o envenos un mensaje a travs de MyChart.   No podemos decirle cul ser su copago por los medicamentos por adelantado ya que esto es diferente dependiendo de la cobertura de su seguro. Sin embargo, es posible que podamos encontrar un medicamento sustituto a Electrical engineer un formulario para que el seguro cubra el medicamento que se considera necesario.   Si se requiere una autorizacin previa para que su compaa de seguros [place] su medicamento, por favor permtanos de 1 a 2 das hbiles para completar este proceso.  Los precios de los medicamentos varan con frecuencia dependiendo del Environmental consultant de dnde se surte la receta y alguna farmacias pueden ofrecer precios ms baratos.  El sitio web www.goodrx.com tiene cupones para medicamentos de Airline pilot. Los precios aqu no tienen en cuenta lo que podra costar con la ayuda del seguro (puede ser  ms barato con su seguro), pero el sitio web puede darle el precio si no utiliz Research scientist (physical sciences).  - Puede imprimir el cupn correspondiente y llevarlo con su receta a la farmacia.  - Tambin puede pasar por nuestra oficina durante el horario de atencin regular y Charity fundraiser una tarjeta de cupones de GoodRx.  - Si necesita que su receta se enve electrnicamente a una farmacia diferente, informe a nuestra oficina a travs de MyChart de [place] o por telfono llamando al [phone] y presione la opcin 4.

## 2023-02-07 DIAGNOSIS — H269 Unspecified cataract: Secondary | ICD-10-CM | POA: Diagnosis not present

## 2023-02-07 DIAGNOSIS — H04123 Dry eye syndrome of bilateral lacrimal glands: Secondary | ICD-10-CM | POA: Diagnosis not present

## 2023-02-07 DIAGNOSIS — H02883 Meibomian gland dysfunction of right eye, unspecified eyelid: Secondary | ICD-10-CM | POA: Diagnosis not present

## 2023-03-11 DIAGNOSIS — J4541 Moderate persistent asthma with (acute) exacerbation: Secondary | ICD-10-CM | POA: Diagnosis not present

## 2023-03-11 DIAGNOSIS — R059 Cough, unspecified: Secondary | ICD-10-CM | POA: Diagnosis not present

## 2023-03-11 DIAGNOSIS — R0602 Shortness of breath: Secondary | ICD-10-CM | POA: Diagnosis not present

## 2023-03-16 ENCOUNTER — Telehealth: Payer: Self-pay

## 2023-03-16 NOTE — Telephone Encounter (Signed)
Pt called triage asking for a refill of Imvexxy and Prempro sent to [facility]. Annual scheduled for 04/11/23 with MMF.

## 2023-03-17 ENCOUNTER — Other Ambulatory Visit: Payer: Self-pay | Admitting: Obstetrics and Gynecology

## 2023-03-17 DIAGNOSIS — N951 Menopausal and female climacteric states: Secondary | ICD-10-CM

## 2023-03-17 DIAGNOSIS — N952 Postmenopausal atrophic vaginitis: Secondary | ICD-10-CM

## 2023-03-17 DIAGNOSIS — N941 Unspecified dyspareunia: Secondary | ICD-10-CM

## 2023-03-17 MED ORDER — IMVEXXY MAINTENANCE PACK 4 MCG VA INST: 4.0000 ug | VAGINAL_INSERT | VAGINAL | 0 refills | Status: DC

## 2023-03-17 MED ORDER — PREMPRO 0.3-1.5 MG PO TABS: 1.0000 | ORAL_TABLET | Freq: Every day | ORAL | 0 refills | Status: DC

## 2023-03-17 NOTE — Telephone Encounter (Signed)
Pt aware.

## 2023-03-17 NOTE — Telephone Encounter (Signed)
Rx RF eRxd to [facility]

## 2023-03-17 NOTE — Progress Notes (Signed)
Rx RF HRT till 6/24 annual

## 2023-03-21 NOTE — Telephone Encounter (Signed)
[facility] has indicated that it is too soon to refill this medication at the pharmacy for your patient

## 2023-03-28 ENCOUNTER — Other Ambulatory Visit: Payer: Self-pay | Admitting: Dermatology

## 2023-03-29 ENCOUNTER — Ambulatory Visit: Payer: 59 | Admitting: Obstetrics and Gynecology

## 2023-04-06 ENCOUNTER — Other Ambulatory Visit: Payer: Self-pay

## 2023-04-06 DIAGNOSIS — N952 Postmenopausal atrophic vaginitis: Secondary | ICD-10-CM

## 2023-04-06 DIAGNOSIS — N941 Unspecified dyspareunia: Secondary | ICD-10-CM

## 2023-04-06 MED ORDER — IMVEXXY MAINTENANCE PACK 4 MCG VA INST: 4.0000 ug | VAGINAL_INSERT | VAGINAL | 0 refills | Status: DC

## 2023-04-06 NOTE — Telephone Encounter (Signed)
Pt calling; has appt next week; needs Imvexxy refill; has been out a week already.  [phone]  Left detailed msg that Imvexxy refill eRx'd.

## 2023-04-11 ENCOUNTER — Other Ambulatory Visit (HOSPITAL_COMMUNITY)
Admission: RE | Admit: 2023-04-11 | Discharge: 2023-04-11 | Disposition: A | Discharge status: Home / Self Care | Payer: 59 | Source: Ambulatory Visit | Attending: Obstetrics and Gynecology | Admitting: Obstetrics and Gynecology

## 2023-04-11 ENCOUNTER — Ambulatory Visit (INDEPENDENT_AMBULATORY_CARE_PROVIDER_SITE_OTHER): Payer: 59 | Admitting: Obstetrics

## 2023-04-11 ENCOUNTER — Encounter: Payer: Self-pay | Admitting: Obstetrics

## 2023-04-11 VITALS — BP 108/84 | HR 76 | Ht 62.5 in | Wt 137.0 lb

## 2023-04-11 DIAGNOSIS — N941 Unspecified dyspareunia: Secondary | ICD-10-CM

## 2023-04-11 DIAGNOSIS — Z01419 Encounter for gynecological examination (general) (routine) without abnormal findings: Secondary | ICD-10-CM

## 2023-04-11 DIAGNOSIS — Z124 Encounter for screening for malignant neoplasm of cervix: Secondary | ICD-10-CM | POA: Insufficient documentation

## 2023-04-11 DIAGNOSIS — N952 Postmenopausal atrophic vaginitis: Secondary | ICD-10-CM

## 2023-04-11 DIAGNOSIS — N951 Menopausal and female climacteric states: Secondary | ICD-10-CM

## 2023-04-11 MED ORDER — IMVEXXY MAINTENANCE PACK 4 MCG VA INST: 4.0000 ug | VAGINAL_INSERT | VAGINAL | 0 refills | Status: DC

## 2023-04-11 MED ORDER — PREMPRO 0.3-1.5 MG PO TABS: 1.0000 | ORAL_TABLET | Freq: Every day | ORAL | 0 refills | Status: DC

## 2023-04-11 NOTE — Progress Notes (Signed)
Gynecology Annual Exam  PCP: Jaclyn Shaggy, MD  Chief Complaint:  Chief Complaint  Patient presents with   Gynecologic Exam    History of Present Illness:Patient is a 62 y.o. G0P0 presents for annual exam. The patient has no complaints today. 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  LMP: No LMP recorded. Patient is postmenopausal. Postcoital Bleeding: no Dysmenorrhea: no  The patient is sexually active. She denies dyspareunia. She uses vaginal Estrogen to address vaginal dryness. The patient does perform self breast exams.  There is no notable family history of breast or ovarian cancer in her family.  The patient wears seatbelts: yes.   The patient has regular exercise: yes.  She lifts weights and exercises her dogs, and also does some plank exercises.  The patient denies current symptoms of depression.     Review of Systems: Review of Systems  Constitutional: Negative.   HENT: Negative.    Eyes: Negative.   Respiratory: Negative.    Cardiovascular: Negative.   Gastrointestinal: Negative.   Genitourinary: Negative.   Musculoskeletal: Negative.   Skin: Negative.   Neurological: Negative.   Endo/Heme/Allergies: Negative.   Psychiatric/Behavioral: Negative.      Past Medical History:  Patient Active Problem List   Diagnosis Date Noted   Encounter for screening colonoscopy 04/26/2013    Past Surgical History:  Past Surgical History:  Procedure Laterality Date   COLONOSCOPY WITH PROPOFOL N/A 10/25/2019   Procedure: COLONOSCOPY WITH PROPOFOL;  Surgeon: Sung Amabile, DO;  Location: [facility]COPY;  Service: General;  Laterality: N/A;   CRANIOTOMY  2000   LAPAROSCOPY  1992    Gynecologic History:  No LMP recorded. Patient is postmenopausal. Last Pap: Results were: NILM no abnormalities  Last mammogram: up to date. Performed on 12/2022, Results were: BI-RAD I  Obstetric History: G0P0  Family History:  Family  History  Problem Relation Age of Onset   Breast cancer Cousin 70    Social History:  Social History   Socioeconomic History   Marital status: Married    Spouse name: Not on file   Number of children: Not on file   Years of education: Not on file   Highest education level: Not on file  Occupational History   Not on file  Tobacco Use   Smoking status: Never   Smokeless tobacco: Never  Vaping Use   Vaping Use: Never used  Substance and Sexual Activity   Alcohol use: Yes   Drug use: No   Sexual activity: Yes    Birth control/protection: Post-menopausal  Other Topics Concern   Not on file  Social History Narrative   Not on file   Social Determinants of Health   Financial Resource Strain: Not on file  Food Insecurity: Not on file  Transportation Needs: Not on file  Physical Activity: Unknown (10/20/2017)   Exercise Vital Sign    Days of Exercise per Week: 7 days    Minutes of Exercise per Session: Not on file  Stress: No Stress Concern Present (10/20/2017)   [employer] of Occupational Health - Occupational Stress Questionnaire    Feeling of Stress : Not at all  Social Connections: Moderately Isolated (10/20/2017)   Social Connection and Isolation Panel [NHANES]    Frequency of Communication with Friends and Family: More than three times a week    Frequency of Social Gatherings with Friends and Family: More than three times a week    Attends  Religious Services: Never    Active Member of Clubs or Organizations: No    Attends Banker Meetings: Never    Marital Status: Separated  Intimate Partner Violence: Not At Risk (10/20/2017)   Humiliation, Afraid, Rape, and Kick questionnaire    Fear of Current or Ex-Partner: No    Emotionally Abused: No    Physically Abused: No    Sexually Abused: No    Allergies:  No Known Allergies  Medications: Prior to Admission medications   Medication Sig Start Date End Date Taking? Authorizing Provider   Estradiol (IMVEXXY MAINTENANCE PACK) 4 MCG INST Place 4 mcg vaginally 2 (two) times a week. 04/07/23  Yes Copland, Alicia B, PA-C  estrogen, conjugated,-medroxyprogesterone (PREMPRO) 0.3-1.5 MG tablet Take 1 tablet by mouth daily. 03/17/23  Yes Copland, Ilona Sorrel, PA-C  Ivermectin (SOOLANTRA) 1 % CREA Apply to affected areas on face once daily 01/26/23  Yes Moye, [place], MD  TRELEGY ELLIPTA 200-62.5-25 MCG/ACT AEPB Inhale 1 puff into the lungs daily.   Yes [provider]  clobetasol ointment (TEMOVATE) 0.05 % Apply 1 application topically 2 (two) times daily. Patient not taking: Reported on 03/17/2022 07/02/20   Neale Burly, [place], MD  hydrocortisone 2.5 % cream Apply topically 2 (two) times daily as needed (Rash). Patient not taking: Reported on 03/17/2022 07/02/20   Neale Burly, [place], MD  mupirocin ointment (BACTROBAN) 2 % Apply 1 application topically 2 (two) times daily. Patient not taking: Reported on 03/17/2022 07/02/20   Sandi Mealy, MD  nystatin-triamcinolone ointment [facility]) Apply 1 application. topically 2 (two) times daily. Patient not taking: Reported on 04/11/2023 03/17/22   Constant, Peggy, MD  Oxymetazoline HCl (RHOFADE) 1 % CREA Apply to the face QAM may repeat mid day or evening PRN. Patient not taking: Reported on 01/26/2023 10/22/21   Neale Burly, [place], MD  triamcinolone lotion (KENALOG) 0.1 % APPLY TWICE DAILY TO SCALP AND FOR UP TO2 WEEKS TO EARS AS DIRECTED. AVOID APPLYING TO FACE, GROIN, AND AXILLA. LONG TERM USE CAN CAUSE THINNING OF SKIN Patient not taking: Reported on 04/11/2023 03/28/23   Sandi Mealy, MD    Physical Exam Vitals: Blood pressure 108/84, pulse 76, height 5' 2.5" (1.588 m), weight 137 lb (62.1 kg).  General: NAD HEENT: normocephalic, anicteric Thyroid: no enlargement, no palpable nodules Pulmonary: No increased work of breathing, CTAB Cardiovascular: RRR, distal pulses 2+ Breast: Breast symmetrical, no tenderness, no palpable nodules or masses, no skin or  nipple retraction present, no nipple discharge.  No axillary or supraclavicular lymphadenopathy. Abdomen: NABS, soft, non-tender, non-distended.  Umbilicus without lesions.  No hepatomegaly, splenomegaly or masses palpable. No evidence of hernia  Genitourinary:  External: Normal external female genitalia.  Normal urethral meatus, normal Bartholin's and Skene's glands.    Vagina: Normal vaginal mucosa, no evidence of prolapse.    Cervix: Grossly normal in appearance, no bleeding  Uterus: Non-enlarged, mobile, normal contour.  No CMT  Adnexa: ovaries non-enlarged, no adnexal masses  Rectal: deferred  Lymphatic: no evidence of inguinal lymphadenopathy Extremities: no edema, erythema, or tenderness Neurologic: Grossly intact Psychiatric: mood appropriate, affect full  Female chaperone present for pelvic and breast  portions of the physical exam     Assessment: 62 y.o. G0P0 routine annual exam  Plan: Problem List Items Addressed This Visit   None Visit Diagnoses     Cervical cancer screening    -  Primary   Relevant Orders   Cytology - PAP   Encounter for annual routine gynecological examination  Relevant Orders   Cytology - PAP   Vaginal atrophy       Relevant Medications   Estradiol (IMVEXXY MAINTENANCE PACK) 4 MCG INST   Dyspareunia in female       Relevant Medications   Estradiol (IMVEXXY MAINTENANCE PACK) 4 MCG INST   Vasomotor symptoms due to menopause       Relevant Medications   estrogen, conjugated,-medroxyprogesterone (PREMPRO) 0.3-1.5 MG tablet       1) Mammogram - recommend yearly screening mammogram.  Mammogram Is up to date  2) STI screening  wasoffered and declined  3) ASCCP guidelines and rational discussed.  Patient opts for discontinue age >39 screening interval  4) Osteoporosis  - per USPTF routine screening DEXA at age 24 -  Consider FDA-approved medical therapies in postmenopausal women and men aged 21 years and older, based on the  following: a) A hip or vertebral (clinical or morphometric) fracture b) T-score ? -2.5 at the femoral neck or spine after appropriate evaluation to exclude secondary causes C) Low bone mass (T-score between -1.0 and -2.5 at the femoral neck or spine) and a 10-year probability of a hip fracture ? 3% or a 10-year probability of a major osteoporosis-related fracture ? 20% based on the [place]-adapted WHO algorithm Will place an order for DEXA for her.  5) Routine healthcare maintenance including cholesterol, diabetes screening discussed managed by PCP  6) Colonoscopy is up to date..  Screening recommended starting at age 3 for average risk individuals, age 29 for individuals deemed at increased risk (including African Americans) and recommended to continue until age 78.  For patient age 63-85 individualized approach is recommended.  Gold standard screening is via colonoscopy, Cologuard screening is an acceptable alternative for patient unwilling or unable to undergo colonoscopy.  "Colorectal cancer screening for average?risk adults: 2018 guideline update from the American Cancer Society"CA: A Cancer Journal for Clinicians: Apr 06, 2017   7) Return in about 1 year (around 04/10/2024) for annual.     Mirna Mires, CNM  04/11/2023 12:53 PM    04/11/2023, 12:53 PM

## 2023-04-12 ENCOUNTER — Other Ambulatory Visit: Payer: Self-pay | Admitting: Obstetrics

## 2023-04-12 DIAGNOSIS — N941 Unspecified dyspareunia: Secondary | ICD-10-CM

## 2023-04-12 DIAGNOSIS — N952 Postmenopausal atrophic vaginitis: Secondary | ICD-10-CM

## 2023-04-15 LAB — CYTOLOGY - PAP
Comment: NEGATIVE
Diagnosis: NEGATIVE
High risk HPV: NEGATIVE

## 2023-04-18 ENCOUNTER — Encounter: Payer: Self-pay | Admitting: Obstetrics

## 2023-04-18 ENCOUNTER — Telehealth: Payer: Self-pay

## 2023-04-18 ENCOUNTER — Other Ambulatory Visit: Payer: Self-pay | Admitting: Obstetrics

## 2023-04-18 DIAGNOSIS — N952 Postmenopausal atrophic vaginitis: Secondary | ICD-10-CM

## 2023-04-18 NOTE — Telephone Encounter (Signed)
Pharmacy called to say that the Imvexxy is not available and they have no dates as to when it would be available. I called patient to see if she wanted to switch pharmacies to see if another pharmacy might have it. She said it was fine, but could not give me a pharmacy to send it to. She said she would think about it and call me back. I told her I would put her some samples at the front desk in the meantime.

## 2023-04-19 ENCOUNTER — Telehealth: Payer: Self-pay

## 2023-04-19 NOTE — Telephone Encounter (Signed)
Carollee Herter calling from [facility]; Imvexxy 4mg  is still on backorder from manufacturer; would you like to change rx to something else?  [phone] ref# [id number]

## 2023-04-20 ENCOUNTER — Other Ambulatory Visit: Payer: Self-pay | Admitting: Obstetrics

## 2023-04-25 ENCOUNTER — Other Ambulatory Visit: Payer: Self-pay | Admitting: Obstetrics

## 2023-04-25 DIAGNOSIS — N941 Unspecified dyspareunia: Secondary | ICD-10-CM

## 2023-04-25 MED ORDER — ESTRADIOL 0.1 MG/GM VA CREA: 1.0000 | TOPICAL_CREAM | VAGINAL | 3 refills | Status: DC

## 2023-04-26 NOTE — Telephone Encounter (Signed)
MyChart msg sent.

## 2023-05-03 DIAGNOSIS — R0602 Shortness of breath: Secondary | ICD-10-CM | POA: Diagnosis not present

## 2023-06-20 DIAGNOSIS — H5203 Hypermetropia, bilateral: Secondary | ICD-10-CM | POA: Diagnosis not present

## 2023-06-20 DIAGNOSIS — H04123 Dry eye syndrome of bilateral lacrimal glands: Secondary | ICD-10-CM | POA: Diagnosis not present

## 2023-06-20 DIAGNOSIS — D3131 Benign neoplasm of right choroid: Secondary | ICD-10-CM | POA: Diagnosis not present

## 2023-06-20 DIAGNOSIS — H52223 Regular astigmatism, bilateral: Secondary | ICD-10-CM | POA: Diagnosis not present

## 2023-06-20 DIAGNOSIS — D3132 Benign neoplasm of left choroid: Secondary | ICD-10-CM | POA: Diagnosis not present

## 2023-06-20 DIAGNOSIS — H524 Presbyopia: Secondary | ICD-10-CM | POA: Diagnosis not present

## 2023-06-20 DIAGNOSIS — H40003 Preglaucoma, unspecified, bilateral: Secondary | ICD-10-CM | POA: Diagnosis not present

## 2023-06-22 ENCOUNTER — Other Ambulatory Visit: Payer: Self-pay | Admitting: Obstetrics

## 2023-06-22 DIAGNOSIS — N951 Menopausal and female climacteric states: Secondary | ICD-10-CM

## 2023-08-29 ENCOUNTER — Other Ambulatory Visit: Payer: Self-pay | Admitting: Obstetrics

## 2023-08-29 DIAGNOSIS — N951 Menopausal and female climacteric states: Secondary | ICD-10-CM

## 2023-10-26 ENCOUNTER — Other Ambulatory Visit: Payer: Self-pay | Admitting: Obstetrics

## 2023-10-26 DIAGNOSIS — N941 Unspecified dyspareunia: Secondary | ICD-10-CM

## 2023-10-26 DIAGNOSIS — N952 Postmenopausal atrophic vaginitis: Secondary | ICD-10-CM

## 2023-11-03 ENCOUNTER — Other Ambulatory Visit: Payer: Self-pay

## 2023-11-03 ENCOUNTER — Telehealth: Payer: Self-pay

## 2023-11-03 NOTE — Telephone Encounter (Signed)
Patient calling about Imvexxy. She needs a refill. Please advise.

## 2023-11-07 NOTE — Telephone Encounter (Signed)
Since it is a topical estrogen vaginally, you can combine with prempro (plus progesterone is protecting endometrial lining).

## 2023-11-09 ENCOUNTER — Other Ambulatory Visit: Payer: Self-pay | Admitting: Obstetrics

## 2023-11-09 DIAGNOSIS — N941 Unspecified dyspareunia: Secondary | ICD-10-CM

## 2023-11-09 MED ORDER — ESTRADIOL 0.1 MG/GM VA CREA: 1.0000 | TOPICAL_CREAM | VAGINAL | 3 refills | Status: DC

## 2023-11-16 DIAGNOSIS — J4540 Moderate persistent asthma, uncomplicated: Secondary | ICD-10-CM | POA: Diagnosis not present

## 2023-11-17 NOTE — Progress Notes (Signed)
 Christina Honor BROCKS, Christina Neal   Chief Complaint  Patient presents with   Follow-up    Possible reaction to imvexxy , stopped it for two weeks and when started again developed rash on body.    HPI:      Christina Neal is a 63 y.o. G0P0 whose LMP was No LMP recorded. Patient is postmenopausal., presents today for body rash since 1/24. Pt was on prempro  for a couple yrs for VS sx with improvement. Stopped it for 2 wks 12/23 and restarted again in 1/24. Pt then developed scaly itchy rash all over her body and she thinks related to prempro . Has continued to take it daily for a year and comes in today to address. Has rash on ear, scalp, arms, chest, hip bones, buttocks. Takes hot showers, no moisturizer use, uses Tide scented detergent, dove sensitive skin soap in genital area, and no dryer sheets. Scratches at night due to itch. Was given kenalog  crm 0.1% by Dr. Ethan last yr for ear/scalp rash. Has derm appt 3/25. Hx of psoriasis.   Was using Imvexxy  4 mg twice wkly for vaginal dryness with sx control but Rx was on back order so pt unable to get for awhile. Rx estradiol  vag crm Rxd instead but pt states she never picked it up. Is sexually active, has some pain/dryness with sex. Sx better with imvexxy .    Patient Active Problem List   Diagnosis Date Noted   Encounter for screening colonoscopy 04/26/2013    Past Surgical History:  Procedure Laterality Date   COLONOSCOPY WITH PROPOFOL  N/A 10/25/2019   Procedure: COLONOSCOPY WITH PROPOFOL ;  Surgeon: Tye Millet, DO;  Location: [facility]COPY;  Service: General;  Laterality: N/A;   CRANIOTOMY  2000   LAPAROSCOPY  1992    Family History  Problem Relation Age of Onset   Breast cancer Cousin 67    Social History   Socioeconomic History   Marital status: Married    Spouse name: Not on file   Number of children: Not on file   Years of education: Not on file   Highest education level: Not on file  Occupational History   Not on file  Tobacco  Use   Smoking status: Never   Smokeless tobacco: Never  Vaping Use   Vaping status: Never Used  Substance and Sexual Activity   Alcohol use: Yes   Drug use: No   Sexual activity: Yes    Birth control/protection: Post-menopausal  Other Topics Concern   Not on file  Social History Narrative   Not on file   Social Drivers of Health   Financial Resource Strain: Not on file  Food Insecurity: Not on file  Transportation Needs: Not on file  Physical Activity: Unknown (10/20/2017)   Exercise Vital Sign    Days of Exercise per Week: 7 days    Minutes of Exercise per Session: Not on file  Stress: No Stress Concern Present (10/20/2017)   [employer] of Occupational Health - Occupational Stress Questionnaire    Feeling of Stress : Not at all  Social Connections: Moderately Isolated (10/20/2017)   Social Connection and Isolation Panel [NHANES]    Frequency of Communication with Friends and Family: More than three times a week    Frequency of Social Gatherings with Friends and Family: More than three times a week    Attends Religious Services: Never    Database Administrator or Organizations: No    Attends Banker Meetings: Never  Marital Status: Separated  Intimate Partner Violence: Not At Risk (10/20/2017)   Humiliation, Afraid, Rape, and Kick questionnaire    Fear of Current or Ex-Partner: No    Emotionally Abused: No    Physically Abused: No    Sexually Abused: No    Outpatient Medications Prior to Visit  Medication Sig Dispense Refill   naproxen (NAPRELAN) 500 MG 24 hr tablet Take by mouth as needed.     TRELEGY ELLIPTA 200-62.5-25 MCG/ACT AEPB Inhale 1 puff into the lungs daily.     estrogen, conjugated,-medroxyprogesterone (PREMPRO ) 0.3-1.5 MG tablet TAKE 1 TABLET DAILY 84 tablet 2   estradiol  (ESTRACE ) 0.1 MG/GM vaginal cream Place 1 Applicatorful vaginally once a week. (Patient not taking: Reported on 11/18/2023) 78 g 3   Ivermectin  (SOOLANTRA ) 1 % CREA  Apply to affected areas on face once daily 45 g 6   No facility-administered medications prior to visit.      ROS:  Review of Systems  Constitutional:  Negative for fever.  Gastrointestinal:  Negative for blood in stool, constipation, diarrhea, nausea and vomiting.  Genitourinary:  Negative for dyspareunia, dysuria, flank pain, frequency, hematuria, urgency, vaginal bleeding, vaginal discharge and vaginal pain.  Musculoskeletal:  Negative for back pain.  Skin:  Negative for rash.   BREAST: No symptoms   OBJECTIVE:   Vitals:  BP 122/77   Pulse 66   Ht 5' 2 (1.575 m)   Wt 138 lb (62.6 kg)   BMI 25.24 kg/m   Physical Exam Vitals reviewed.  Constitutional:      Appearance: She is well-developed.  Pulmonary:     Effort: Pulmonary effort is normal.  Musculoskeletal:        General: Normal range of motion.     Cervical back: Normal range of motion.  Skin:    General: Skin is warm and dry.     Findings: Rash present. Rash is scaling.          Comments: MULTIPLE ERYTHEMATOUS PATCHES WITH SCALE/EXCORIATIONS; OVERALL VERY DRY SKIN ON ENTIRE BODY  Neurological:     General: No focal deficit present.     Mental Status: She is alert and oriented to person, place, and time.     Cranial Nerves: No cranial nerve deficit.  Psychiatric:        Mood and Affect: Mood normal.        Behavior: Behavior normal.        Thought Content: Thought content normal.        Judgment: Judgment normal.     Assessment/Plan: Vasomotor symptoms due to menopause - Plan: Estradiol -Norethindrone  Acet 0.5-0.1 MG tablet; doubt rash is related to prempro  use since had been on prempro  before restarting Rx. Also, rash looks like eczema. Will change to Rx activella in case pt allergic to filler ingredient in prempro . F/u prn.   Vaginal atrophy - Plan: Estradiol  (IMVEXXY  MAINTENANCE PACK) 4 MCG INST; Rx RF imvexxy . F/u prn. Can use coconut oil as moisturizer/lubricant prn.   Dyspareunia in female -  Plan: Estradiol  (IMVEXXY  MAINTENANCE PACK) 4 MCG INST  Other eczema - Plan: triamcinolone  lotion (KENALOG ) 0.1 %; cool showers, moisturize with cetaphil/cerave crm, Rx RF kenalog  crm for patches to use BID for a few days then once daily till resolves. Don't scratch, change to free and clear detergent. Pt to get sooner f/u with derm.    Meds ordered this encounter  Medications   Estradiol  (IMVEXXY  MAINTENANCE PACK) 4 MCG INST    Sig: Place 4 mcg  vaginally 2 (two) times a week.    Dispense:  24 each    Refill:  1    Supervising Provider:   CHERRY, ANIKA [[id number]]   Estradiol -Norethindrone  Acet 0.5-0.1 MG tablet    Sig: Take 1 tablet by mouth daily.    Dispense:  84 tablet    Refill:  1    Supervising Provider:   CHERRY, ANIKA [[id number]]   triamcinolone  lotion (KENALOG ) 0.1 %    Sig: APPLY TWICE DAILY TO SCALP AND FOR UP TO2 WEEKS TO EARS AS DIRECTED. AVOID APPLYING TO FACE, GROIN, AND AXILLA. LONG TERM USE CAN CAUSE THINNING OF SKIN    Dispense:  60 mL    Refill:  0    FOR NEXT FILL. THANKS.    Supervising Provider:   CHERRY, ANIKA [[id number]]      Return in about 5 months (around 04/17/2024) for annual.  Christina Bains B. Lennyn Bellanca, PA-C 11/18/2023 11:51 AM

## 2023-11-18 ENCOUNTER — Ambulatory Visit: Payer: 59 | Admitting: Obstetrics and Gynecology

## 2023-11-18 ENCOUNTER — Encounter: Payer: Self-pay | Admitting: Obstetrics and Gynecology

## 2023-11-18 VITALS — BP 122/77 | HR 66 | Ht 62.0 in | Wt 138.0 lb

## 2023-11-18 DIAGNOSIS — N951 Menopausal and female climacteric states: Secondary | ICD-10-CM | POA: Diagnosis not present

## 2023-11-18 DIAGNOSIS — L308 Other specified dermatitis: Secondary | ICD-10-CM | POA: Diagnosis not present

## 2023-11-18 DIAGNOSIS — N941 Unspecified dyspareunia: Secondary | ICD-10-CM | POA: Diagnosis not present

## 2023-11-18 DIAGNOSIS — N952 Postmenopausal atrophic vaginitis: Secondary | ICD-10-CM

## 2023-11-18 MED ORDER — IMVEXXY MAINTENANCE PACK 4 MCG VA INST: 4.0000 ug | VAGINAL_INSERT | VAGINAL | 1 refills | Status: DC

## 2023-11-18 MED ORDER — ESTRADIOL-NORETHINDRONE ACET 0.5-0.1 MG PO TABS: 1.0000 | ORAL_TABLET | Freq: Every day | ORAL | 1 refills | Status: DC

## 2023-11-18 MED ORDER — TRIAMCINOLONE ACETONIDE 0.1 % EX LOTN: TOPICAL_LOTION | CUTANEOUS | 0 refills | Status: AC

## 2023-11-18 NOTE — Patient Instructions (Signed)
 I value your feedback and you entrusting [place] with your care. If you get a [place] patient survey, I would appreciate you taking the time to let us know about your experience today. Thank you! ? ? ?

## 2023-12-02 DIAGNOSIS — Z0000 Encounter for general adult medical examination without abnormal findings: Secondary | ICD-10-CM | POA: Diagnosis not present

## 2024-02-02 ENCOUNTER — Ambulatory Visit: Payer: 59 | Admitting: Dermatology

## 2024-02-02 ENCOUNTER — Encounter: Payer: Self-pay | Admitting: Dermatology

## 2024-02-02 DIAGNOSIS — T148XXA Other injury of unspecified body region, initial encounter: Secondary | ICD-10-CM

## 2024-02-02 DIAGNOSIS — L308 Other specified dermatitis: Secondary | ICD-10-CM

## 2024-02-02 DIAGNOSIS — L309 Dermatitis, unspecified: Secondary | ICD-10-CM | POA: Diagnosis not present

## 2024-02-02 DIAGNOSIS — R21 Rash and other nonspecific skin eruption: Secondary | ICD-10-CM | POA: Diagnosis not present

## 2024-02-02 DIAGNOSIS — L719 Rosacea, unspecified: Secondary | ICD-10-CM

## 2024-02-02 MED ORDER — CLOBETASOL PROPIONATE 0.05 % EX CREA: TOPICAL_CREAM | CUTANEOUS | 2 refills | Status: AC

## 2024-02-02 NOTE — Patient Instructions (Addendum)
 Wound Care Instructions  Cleanse wound gently with soap and water once a day then pat dry with clean gauze. Apply a thin coat of Petrolatum (petroleum jelly, "Vaseline") over the wound (unless you have an allergy to this). We recommend that you use a new, sterile tube of Vaseline. Do not pick or remove scabs. Do not remove the yellow or white "healing tissue" from the base of the wound.  Cover the wound with fresh, clean, nonstick gauze and secure with paper tape. You may use Band-Aids in place of gauze and tape if the wound is small enough, but would recommend trimming much of the tape off as there is often too much. Sometimes Band-Aids can irritate the skin.  You should call the office for your biopsy report after 1 week if you have not already been contacted.  If you experience any problems, such as abnormal amounts of bleeding, swelling, significant bruising, significant pain, or evidence of infection, please call the office immediately.  FOR ADULT SURGERY PATIENTS: If you need something for pain relief you may take 1 extra strength Tylenol (acetaminophen) AND 2 Ibuprofen (200mg  each) together every 4 hours as needed for pain. (do not take these if you are allergic to them or if you have a reason you should not take them.) Typically, you may only need pain medication for 1 to 3 days.     Start Clobetasol cream twice daily to affected body areas until smooth. Avoid applying to face, groin, and axilla. Use as directed. Long-term use can cause thinning of the skin.    Topical steroids (such as triamcinolone, fluocinolone, fluocinonide, mometasone, clobetasol, halobetasol, betamethasone, hydrocortisone) can cause thinning and lightening of the skin if they are used for too long in the same area. Your physician has selected the right strength medicine for your problem and area affected on the body. Please use your medication only as directed by your physician to prevent side effects.    Gentle  Skin Care Guide  1. Bathe no more than once a day.  2. Avoid bathing in hot water  3. Use a mild soap like Dove, Vanicream, Cetaphil, CeraVe. Can use Lever 2000 or Cetaphil antibacterial soap  4. Use soap only where you need it. On most days, use it under your arms, between your legs, and on your feet. Let the water rinse other areas unless visibly dirty.  5. When you get out of the bath/shower, use a towel to gently blot your skin dry, don't rub it.  6. While your skin is still a little damp, apply a moisturizing cream such as Vanicream, CeraVe, Cetaphil, Eucerin, Sarna lotion or plain Vaseline Jelly. For hands apply Neutrogena [place] Hand Cream or Excipial Hand Cream.  7. Reapply moisturizer any time you start to itch or feel dry.  8. Sometimes using free and clear laundry detergents can be helpful. Fabric softener sheets should be avoided. Downy Free & Gentle liquid, or any liquid fabric softener that is free of dyes and perfumes, it acceptable to use  9. If your doctor has given you prescription creams you may apply moisturizers over them       Due to recent changes in healthcare laws, you may see results of your pathology and/or laboratory studies on MyChart before the doctors have had a chance to review them. We understand that in some cases there may be results that are confusing or concerning to you. Please understand that not all results are received at the same time and often  the doctors may need to interpret multiple results in order to provide you with the best plan of care or course of treatment. Therefore, we ask that you please give [place] 2 business days to thoroughly review all your results before contacting the office for clarification. Should we see a critical lab result, you will be contacted sooner.   If You Need Anything After Your Visit  If you have any questions or concerns for your doctor, please call our main line at [phone] and press option 4 to reach your  doctor's medical assistant. If no one answers, please leave a voicemail as directed and we will return your call as soon as possible. Messages left after 4 pm will be answered the following business day.   You may also send [place] a message via MyChart. We typically respond to MyChart messages within 1-2 business days.  For prescription refills, please ask your pharmacy to contact our office. Our fax number is [phone].  If you have an urgent issue when the clinic is closed that cannot wait until the next business day, you can page your doctor at the number below.    Please note that while we do our best to be available for urgent issues outside of office hours, we are not available 24/7.   If you have an urgent issue and are unable to reach [place], you may choose to seek medical care at your doctor's office, retail clinic, urgent care center, or emergency room.  If you have a medical emergency, please immediately call 911 or go to the emergency department.  Pager Numbers  - Dr. Gwen Pounds: [phone]  - Dr. Roseanne Reno: [phone]  - Dr. Katrinka Blazing: [phone]   In the event of inclement weather, please call our main line at [phone] for an update on the status of any delays or closures.  Dermatology Medication Tips: Please keep the boxes that topical medications come in in order to help keep track of the instructions about where and how to use these. Pharmacies typically print the medication instructions only on the boxes and not directly on the medication tubes.   If your medication is too expensive, please contact our office at [phone] option 4 or send [place] a message through MyChart.   We are unable to tell what your co-pay for medications will be in advance as this is different depending on your insurance coverage. However, we may be able to find a substitute medication at lower cost or fill out paperwork to get insurance to cover a needed medication.   If a prior authorization is  required to get your medication covered by your insurance company, please allow [place] 1-2 business days to complete this process.  Drug prices often vary depending on where the prescription is filled and some pharmacies may offer cheaper prices.  The website www.goodrx.com contains coupons for medications through different pharmacies. The prices here do not account for what the cost may be with help from insurance (it may be cheaper with your insurance), but the website can give you the price if you did not use any insurance.  - You can print the associated coupon and take it with your prescription to the pharmacy.  - You may also stop by our office during regular business hours and pick up a GoodRx coupon card.  - If you need your prescription sent electronically to a different pharmacy, notify our office through [facility] or by phone at [phone] option 4.     Si Usted  Necesita Algo Despus de Su Visita  Tambin puede enviarnos un mensaje a travs de [place]. Por lo general respondemos a los mensajes de MyChart en el transcurso de 1 a 2 das hbiles.  Para renovar recetas, por favor pida a su farmacia que se ponga en contacto con nuestra oficina. Annie Sable de fax es [place] [phone].  Si tiene un asunto urgente cuando la clnica est cerrada y que no puede esperar hasta el siguiente da hbil, puede llamar/localizar a su doctor(a) al nmero que aparece a continuacin.   Por favor, tenga en cuenta que aunque hacemos todo lo posible para estar disponibles para asuntos urgentes fuera del horario de [place], no estamos disponibles las 24 horas del da, los 7 [address]  [address] de la [place].   Si tiene un problema urgente y no puede comunicarse con nosotros, puede optar por buscar atencin mdica  en el consultorio de su doctor(a), en una clnica privada, en un centro de atencin urgente o en una sala de emergencias.  Si tiene Engineer, drilling, por favor llame inmediatamente al 911 o vaya a  la sala de emergencias.  Nmeros de bper  - Dr. Gwen Pounds: [phone]  - Dra. Roseanne Reno: [phone]  - Dr. Katrinka Blazing: [phone]   En caso de inclemencias del tiempo, por favor llame a Lacy Duverney principal al [phone] para una actualizacin sobre el [place] de cualquier retraso o cierre.  Consejos para la medicacin en dermatologa: Por favor, guarde las cajas en las que vienen los medicamentos de uso tpico para ayudarle a seguir las instrucciones sobre dnde y cmo usarlos. Las farmacias generalmente imprimen las instrucciones del medicamento slo en las cajas y no directamente en los tubos del [place].   Si su medicamento es muy caro, por favor, pngase en contacto con Rolm Gala llamando al [phone] y presione la opcin 4 o envenos un mensaje a travs de Clinical cytogeneticist.   No podemos decirle cul ser su copago por los medicamentos por adelantado ya que esto es diferente dependiendo de la cobertura de su seguro. Sin embargo, es posible que podamos encontrar un medicamento sustituto a Audiological scientist un formulario para que el seguro cubra el medicamento que se considera necesario.   Si se requiere una autorizacin previa para que su compaa de seguros [place] su medicamento, por favor permtanos de 1 a 2 das hbiles para completar [address]  Los precios de los medicamentos varan con frecuencia dependiendo del Environmental consultant de dnde se surte la receta y alguna farmacias pueden ofrecer precios ms baratos.  El sitio web www.goodrx.com tiene cupones para medicamentos de Health and safety inspector. Los precios aqu no tienen en cuenta lo que podra costar con la ayuda del seguro (puede ser ms barato con su seguro), pero el sitio web puede darle el precio si no utiliz Tourist information centre manager.  - Puede imprimir el cupn correspondiente y llevarlo con su receta a la farmacia.  - Tambin puede pasar por nuestra oficina durante el horario de atencin regular y Education officer, museum una tarjeta de cupones de  GoodRx.  - Si necesita que su receta se enve electrnicamente a una farmacia diferente, informe a nuestra oficina a travs de MyChart de [place] o por telfono llamando al [phone] y presione la opcin 4.

## 2024-02-02 NOTE — Progress Notes (Signed)
   Follow-Up Visit   Subjective  Christina Neal is a 63 y.o. female who presents for the following: yearly rosacea follow up. Uses Soolantra but only as needed.   Hx of seborrheic dermatitis at scalp and ears. Used Triamcinolone lotion twice a day as needed. OTC dandruff shampoos did not help at at.  States has been terrible this year. C/O sores on scalp. Thinks has psoriasis on scalp.  Mother and sister have hx of psoriasis.  Denies joint pain.  C/O eczema at scattered body. Itching all the time. Has not used any prescription treatments. Has been using CeraVe moisturizing cream.   The patient has spots, moles and lesions to be evaluated, some may be new or changing and the patient may have concern these could be cancer.   The following portions of the chart were reviewed this encounter and updated as appropriate: medications, allergies, medical history  Review of Systems:  No other skin or systemic complaints except as noted in HPI or Assessment and Plan.  Objective  Well appearing patient in no apparent distress; mood and affect are within normal limits.  A focused examination was performed of the following areas: Scalp, face  Relevant exam findings are noted in the Assessment and Plan.  Left Lateral Abdomen Broad pink scaly lichenified plaques of bilateral lower abdomen, right upper trunk at neck base, left shoulder, lower back, right forearm, upper back, left hip, focally in scalp with excoriations   Assessment & Plan   ROSACEA Exam Mild face erythema   Chronic condition with duration or expected duration over one year. Currently well-controlled.   Rosacea is a chronic progressive skin condition usually affecting the face of adults, causing redness and/or acne bumps. It is treatable but not curable. It sometimes affects the eyes (ocular rosacea) as well. It may respond to topical and/or systemic medication and can flare with stress, sun exposure, alcohol, exercise, topical  steroids (including hydrocortisone/cortisone 10) and some foods.  Daily application of broad spectrum spf 30+ sunscreen to face is recommended to reduce flares.  Patient denies grittiness of the eyes  Treatment Plan Continue Soolantra daily PRN. Call for refills.     RASH Left Lateral Abdomen Skin / nail biopsy - Left Lateral Abdomen Type of biopsy: punch   Informed consent: discussed and consent obtained   Timeout: patient name, date of birth, surgical site, and procedure verified   Procedure prep:  Patient was prepped and draped in usual sterile fashion Prep type:  Isopropyl alcohol Anesthesia: the lesion was anesthetized in a standard fashion   Anesthetic:  1% lidocaine w/ epinephrine 1-100,000 buffered w/ 8.4% NaHCO3 Punch size:  4 mm Suture size:  4-0 Suture type: Prolene (polypropylene)   Suture removal (days):  7 Hemostasis achieved with: suture, pressure and aluminum chloride   Outcome: patient tolerated procedure well   Post-procedure details: sterile dressing applied and wound care instructions given    clobetasol cream (TEMOVATE) 0.05 % - Left Lateral Abdomen Apply twice daily to affected body areas until smooth. Avoid applying to face, groin, and axilla Specimen 1 - Surgical pathology Differential Diagnosis: eczema vs dermatomyositis vs mycosis fungoides  Check Margins: No ROSACEA   EXCORIATION    Return in about 1 week (around 02/09/2024) for Suture Removal.  I, Lawson Radar, CMA, am acting as scribe for Elie Goody, MD.   Documentation: I have reviewed the above documentation for accuracy and completeness, and I agree with the above.  Elie Goody, MD

## 2024-02-06 LAB — SURGICAL PATHOLOGY

## 2024-02-07 ENCOUNTER — Telehealth: Payer: Self-pay | Admitting: Dermatology

## 2024-02-07 ENCOUNTER — Telehealth: Payer: Self-pay

## 2024-02-07 NOTE — Telephone Encounter (Signed)
 Dr. Katrinka Blazing shared result by phone. Continue clobetasol BID until smooth. Patient does not want to pursue systemic therapy at this time

## 2024-02-07 NOTE — Telephone Encounter (Signed)
 Shared biopsy result with patient. She is improving with clobetasol. Suture removal Thursday. All questions answered

## 2024-02-07 NOTE — Telephone Encounter (Signed)
-----   Message from [place] sent at 02/07/2024  1:28 PM EDT ----- Diagnosis: left lateral abdomen :       SPONGIOTIC DERMATITIS, SEE DESCRIPTION   Plan: I shared result by phone. Continue clobetasol BID until smooth. Patient does not want to pursue systemic therapy at this time

## 2024-02-09 ENCOUNTER — Ambulatory Visit: Admitting: Dermatology

## 2024-02-09 ENCOUNTER — Encounter: Payer: Self-pay | Admitting: Dermatology

## 2024-02-09 DIAGNOSIS — Z4802 Encounter for removal of sutures: Secondary | ICD-10-CM

## 2024-02-09 NOTE — Progress Notes (Signed)
   Follow-Up Visit   Subjective  Christina Neal is a 63 y.o. female who presents for the following: Spongiotic dermatitis bx proven, 1 wk f/u to discuss bx results and suture removal, Clobetasol cr qd to bid, improving The patient has spots, moles and lesions to be evaluated, some may be new or changing and the patient may have concern these could be cancer.   The following portions of the chart were reviewed this encounter and updated as appropriate: medications, allergies, medical history  Review of Systems:  No other skin or systemic complaints except as noted in HPI or Assessment and Plan.  Objective  Well appearing patient in no apparent distress; mood and affect are within normal limits.   A focused examination was performed of the following areas:   Relevant exam findings are noted in the Assessment and Plan.    Assessment & Plan    Encounter for Removal of Sutures - Incision site at the L abdomen is clean, dry and intact - Wound cleansed, sutures removed, wound cleansed and steri strips applied.  - Discussed pathology results showing spongiotic dermatitis  - Patient advised to keep steri-strips dry until they fall off. - Scars remodel for a full year. - Once steri-strips fall off, patient can apply over-the-counter silicone scar cream each night to help with scar remodeling if desired. - Patient advised to call with any concerns or if they notice any new or changing lesions.      Return if symptoms worsen or fail to improve.  I, Ardis Rowan, RMA, am acting as scribe for Elie Goody, MD .   Documentation: I have reviewed the above documentation for accuracy and completeness, and I agree with the above.  Elie Goody, MD

## 2024-02-09 NOTE — Patient Instructions (Signed)

## 2024-02-21 ENCOUNTER — Other Ambulatory Visit: Payer: Self-pay | Admitting: Obstetrics and Gynecology

## 2024-02-21 DIAGNOSIS — Z1231 Encounter for screening mammogram for malignant neoplasm of breast: Secondary | ICD-10-CM

## 2024-03-07 ENCOUNTER — Ambulatory Visit
Admission: RE | Admit: 2024-03-07 | Discharge: 2024-03-07 | Disposition: A | Discharge status: Home / Self Care | Source: Ambulatory Visit | Attending: Obstetrics and Gynecology | Admitting: Obstetrics and Gynecology

## 2024-03-07 DIAGNOSIS — Z1231 Encounter for screening mammogram for malignant neoplasm of breast: Secondary | ICD-10-CM | POA: Diagnosis not present

## 2024-03-11 ENCOUNTER — Encounter: Payer: Self-pay | Admitting: Obstetrics and Gynecology

## 2024-04-10 NOTE — Progress Notes (Deleted)
 PCP: Westley Hammers, MD   No chief complaint on file.   HPI:      [name] is a 63 y.o. G0P0 whose LMP was No LMP recorded. Patient is postmenopausal., presents today for her annual examination.  Her menses are absent due to menopause..... Has VS sx, activella (changed from prempro  due to rash that was prob not related to prempro ) She {does:18564} have vasomotor sx.   Sex activity: {sex active: 315163}. She {does:18564} have vaginal dryness. And using invexxy.  Last Pap: 04/11/23  Results were: no abnormalities /neg HPV DNA.  Hx of STDs: {STD hx:14358}  Last mammogram: 03/07/24 Results were: normal--routine follow-up in 12 months There is no FH of breast cancer. There is no FH of ovarian cancer. The patient {does:18564} do self-breast exams.  Colonoscopy: 12/20  Repeat due after 10*** years.   Tobacco use: {tob:20664} Alcohol use: {Alcohol:11675} No drug use Exercise: {exercise:31265}  She {does:18564} get adequate calcium and Vitamin D in her diet.  Labs with PCP.   Patient Active Problem List   Diagnosis Date Noted   Encounter for screening colonoscopy 04/26/2013    Past Surgical History:  Procedure Laterality Date   COLONOSCOPY WITH PROPOFOL  N/A 10/25/2019   Procedure: COLONOSCOPY WITH PROPOFOL ;  Surgeon: Conrado Delay, DO;  Location: [facility] ENDOSCOPY;  Service: General;  Laterality: N/A;   CRANIOTOMY  2000   LAPAROSCOPY  1992    Family History  Problem Relation Age of Onset   Breast cancer Cousin 10    Social History   Socioeconomic History   Marital status: Married    Spouse name: Not on file   Number of children: Not on file   Years of education: Not on file   Highest education level: Not on file  Occupational History   Not on file  Tobacco Use   Smoking status: Never   Smokeless tobacco: Never  Vaping Use   Vaping status: Never Used  Substance and Sexual Activity   Alcohol use: Yes   Drug use: No   Sexual activity: Yes    Birth  control/protection: Post-menopausal  Other Topics Concern   Not on file  Social History Narrative   Not on file   Social Drivers of Health   Financial Resource Strain: Not on file  Food Insecurity: Not on file  Transportation Needs: Not on file  Physical Activity: Unknown (10/20/2017)   Exercise Vital Sign    Days of Exercise per Week: 7 days    Minutes of Exercise per Session: Not on file  Stress: No Stress Concern Present (10/20/2017)   [employer] of Occupational Health - Occupational Stress Questionnaire    Feeling of Stress : Not at all  Social Connections: Moderately Isolated (10/20/2017)   Social Connection and Isolation Panel [NHANES]    Frequency of Communication with Friends and Family: More than three times a week    Frequency of Social Gatherings with Friends and Family: More than three times a week    Attends Religious Services: Never    Database administrator or Organizations: No    Attends Banker Meetings: Never    Marital Status: Separated  Intimate Partner Violence: Not At Risk (10/20/2017)   Humiliation, Afraid, Rape, and Kick questionnaire    Fear of Current or Ex-Partner: No    Emotionally Abused: No    Physically Abused: No    Sexually Abused: No     Current Outpatient Medications:    clobetasol  cream (  TEMOVATE ) 0.05 %, Apply twice daily to affected body areas until smooth. Avoid applying to face, groin, and axilla, Disp: 60 g, Rfl: 2   Estradiol  (IMVEXXY  MAINTENANCE PACK) 4 MCG INST, Place 4 mcg vaginally 2 (two) times a week., Disp: 24 each, Rfl: 1   Estradiol -Norethindrone  Acet 0.5-0.1 MG tablet, Take 1 tablet by mouth daily., Disp: 84 tablet, Rfl: 1   naproxen (NAPRELAN) 500 MG 24 hr tablet, Take by mouth as needed., Disp: , Rfl:    TRELEGY ELLIPTA 200-62.5-25 MCG/ACT AEPB, Inhale 1 puff into the lungs daily., Disp: , Rfl:    triamcinolone  lotion (KENALOG ) 0.1 %, APPLY TWICE DAILY TO SCALP AND FOR UP TO2 WEEKS TO EARS AS DIRECTED.  AVOID APPLYING TO FACE, GROIN, AND AXILLA. LONG TERM USE CAN CAUSE THINNING OF SKIN, Disp: 60 mL, Rfl: 0     ROS:  Review of Systems BREAST: No symptoms    Objective: There were no vitals taken for this visit.   OBGyn Exam  Results: No results found for this or any previous visit (from the past 24 hours).  Assessment/Plan:  No diagnosis found.   No orders of the defined types were placed in this encounter.           GYN counsel {counseling: 16159}    F/U  No follow-ups on file.  Zykera Abella B. Yochanan Eddleman, PA-C 04/10/2024 12:53 PM

## 2024-04-12 ENCOUNTER — Ambulatory Visit: Admitting: Obstetrics and Gynecology

## 2024-04-12 DIAGNOSIS — Z01419 Encounter for gynecological examination (general) (routine) without abnormal findings: Secondary | ICD-10-CM

## 2024-04-12 DIAGNOSIS — N952 Postmenopausal atrophic vaginitis: Secondary | ICD-10-CM

## 2024-04-12 DIAGNOSIS — N951 Menopausal and female climacteric states: Secondary | ICD-10-CM

## 2024-04-12 DIAGNOSIS — Z1231 Encounter for screening mammogram for malignant neoplasm of breast: Secondary | ICD-10-CM

## 2024-04-12 DIAGNOSIS — Z1211 Encounter for screening for malignant neoplasm of colon: Secondary | ICD-10-CM

## 2024-04-12 DIAGNOSIS — N941 Unspecified dyspareunia: Secondary | ICD-10-CM

## 2024-04-16 ENCOUNTER — Encounter: Payer: Self-pay | Admitting: Obstetrics and Gynecology

## 2024-04-26 ENCOUNTER — Ambulatory Visit: Admitting: Obstetrics and Gynecology

## 2024-04-26 NOTE — Progress Notes (Unsigned)
 PCP: Westley Hammers, MD   No chief complaint on file.   HPI:      [name] is a 63 y.o. G0P0 whose LMP was No LMP recorded. Patient is postmenopausal., presents today for her annual examination.  Her menses are absent due to menopause..... Has VS sx, activella (changed from prempro  due to rash that was prob not related to prempro ) She {does:18564} have vasomotor sx.   Sex activity: {sex active: 315163}. She {does:18564} have vaginal dryness. And using invexxy.  Last Pap: 04/11/23  Results were: no abnormalities /neg HPV DNA.  Hx of STDs: {STD hx:14358}  Last mammogram: 03/07/24 Results were: normal--routine follow-up in 12 months There is no FH of breast cancer. There is no FH of ovarian cancer. The patient {does:18564} do self-breast exams.  Colonoscopy: 12/20  Repeat due after 10*** years.   Tobacco use: {tob:20664} Alcohol use: {Alcohol:11675} No drug use Exercise: {exercise:31265}  She {does:18564} get adequate calcium and Vitamin D in her diet.  Labs with PCP.   Patient Active Problem List   Diagnosis Date Noted   Encounter for screening colonoscopy 04/26/2013    Past Surgical History:  Procedure Laterality Date   COLONOSCOPY WITH PROPOFOL  N/A 10/25/2019   Procedure: COLONOSCOPY WITH PROPOFOL ;  Surgeon: Conrado Delay, DO;  Location: [facility];  Service: General;  Laterality: N/A;   CRANIOTOMY  2000   LAPAROSCOPY  1992    Family History  Problem Relation Age of Onset   Breast cancer Cousin 23    Social History   Socioeconomic History   Marital status: Married    Spouse name: Not on file   Number of children: Not on file   Years of education: Not on file   Highest education level: Not on file  Occupational History   Not on file  Tobacco Use   Smoking status: Never   Smokeless tobacco: Never  Vaping Use   Vaping status: Never Used  Substance and Sexual Activity   Alcohol use: Yes   Drug use: No   Sexual activity: Yes    Birth  control/protection: Post-menopausal  Other Topics Concern   Not on file  Social History Narrative   Not on file   Social Drivers of Health   Financial Resource Strain: Not on file  Food Insecurity: Not on file  Transportation Needs: Not on file  Physical Activity: Unknown (10/20/2017)   Exercise Vital Sign    Days of Exercise per Week: 7 days    Minutes of Exercise per Session: Not on file  Stress: No Stress Concern Present (10/20/2017)   [employer] of Occupational Health - Occupational Stress Questionnaire    Feeling of Stress : Not at all  Social Connections: Moderately Isolated (10/20/2017)   Social Connection and Isolation Panel    Frequency of Communication with Friends and Family: More than three times a week    Frequency of Social Gatherings with Friends and Family: More than three times a week    Attends Religious Services: Never    Database administrator or Organizations: No    Attends Banker Meetings: Never    Marital Status: Separated  Intimate Partner Violence: Not At Risk (10/20/2017)   Humiliation, Afraid, Rape, and Kick questionnaire    Fear of Current or Ex-Partner: No    Emotionally Abused: No    Physically Abused: No    Sexually Abused: No     Current Outpatient Medications:    clobetasol  cream (TEMOVATE )  0.05 %, Apply twice daily to affected body areas until smooth. Avoid applying to face, groin, and axilla, Disp: 60 g, Rfl: 2   Estradiol  (IMVEXXY  MAINTENANCE PACK) 4 MCG INST, Place 4 mcg vaginally 2 (two) times a week., Disp: 24 each, Rfl: 1   Estradiol -Norethindrone  Acet 0.5-0.1 MG tablet, Take 1 tablet by mouth daily., Disp: 84 tablet, Rfl: 1   naproxen (NAPRELAN) 500 MG 24 hr tablet, Take by mouth as needed., Disp: , Rfl:    TRELEGY ELLIPTA 200-62.5-25 MCG/ACT AEPB, Inhale 1 puff into the lungs daily., Disp: , Rfl:    triamcinolone  lotion (KENALOG ) 0.1 %, APPLY TWICE DAILY TO SCALP AND FOR UP TO2 WEEKS TO EARS AS DIRECTED. AVOID  APPLYING TO FACE, GROIN, AND AXILLA. LONG TERM USE CAN CAUSE THINNING OF SKIN, Disp: 60 mL, Rfl: 0     ROS:  Review of Systems BREAST: No symptoms    Objective: There were no vitals taken for this visit.   OBGyn Exam  Results: No results found for this or any previous visit (from the past 24 hours).  Assessment/Plan:  No diagnosis found.   No orders of the defined types were placed in this encounter.           GYN counsel {counseling: 16159}    F/U  No follow-ups on file.  Rosalin Buster B. Korine Winton, PA-C 04/26/2024 12:39 PM

## 2024-04-27 ENCOUNTER — Ambulatory Visit: Admitting: Obstetrics and Gynecology

## 2024-04-27 ENCOUNTER — Encounter: Payer: Self-pay | Admitting: Obstetrics and Gynecology

## 2024-04-27 VITALS — BP 125/75 | HR 70 | Ht 62.0 in | Wt 138.0 lb

## 2024-04-27 DIAGNOSIS — Z1231 Encounter for screening mammogram for malignant neoplasm of breast: Secondary | ICD-10-CM

## 2024-04-27 DIAGNOSIS — Z01419 Encounter for gynecological examination (general) (routine) without abnormal findings: Secondary | ICD-10-CM | POA: Diagnosis not present

## 2024-04-27 DIAGNOSIS — N952 Postmenopausal atrophic vaginitis: Secondary | ICD-10-CM

## 2024-04-27 DIAGNOSIS — N941 Unspecified dyspareunia: Secondary | ICD-10-CM

## 2024-04-27 DIAGNOSIS — N951 Menopausal and female climacteric states: Secondary | ICD-10-CM

## 2024-04-27 MED ORDER — ESTRADIOL-NORETHINDRONE ACET 0.5-0.1 MG PO TABS: 1.0000 | ORAL_TABLET | Freq: Every day | ORAL | 3 refills | Status: AC

## 2024-04-27 MED ORDER — IMVEXXY MAINTENANCE PACK 4 MCG VA INST: 4.0000 ug | VAGINAL_INSERT | VAGINAL | 3 refills | Status: AC

## 2024-04-27 NOTE — Patient Instructions (Signed)
 I value your feedback and you entrusting [place] with your care. If you get a [place] patient survey, I would appreciate you taking the time to let us know about your experience today. Thank you! ? ? ?

## 2024-05-06 ENCOUNTER — Other Ambulatory Visit: Payer: Self-pay | Admitting: Obstetrics and Gynecology

## 2024-05-06 DIAGNOSIS — N941 Unspecified dyspareunia: Secondary | ICD-10-CM

## 2024-05-06 DIAGNOSIS — N952 Postmenopausal atrophic vaginitis: Secondary | ICD-10-CM

## 2024-05-06 DIAGNOSIS — N951 Menopausal and female climacteric states: Secondary | ICD-10-CM

## 2024-06-13 ENCOUNTER — Encounter: Payer: Self-pay | Admitting: Family Medicine

## 2024-06-13 ENCOUNTER — Ambulatory Visit: Admitting: Family Medicine

## 2024-06-13 VITALS — BP 117/82 | HR 68 | Ht 62.5 in | Wt 140.1 lb

## 2024-06-13 DIAGNOSIS — R944 Abnormal results of kidney function studies: Secondary | ICD-10-CM

## 2024-06-13 DIAGNOSIS — J4540 Moderate persistent asthma, uncomplicated: Secondary | ICD-10-CM

## 2024-06-13 DIAGNOSIS — Z860100 Personal history of colon polyps, unspecified: Secondary | ICD-10-CM | POA: Diagnosis not present

## 2024-06-13 DIAGNOSIS — Z7689 Persons encountering health services in other specified circumstances: Secondary | ICD-10-CM

## 2024-06-13 NOTE — Progress Notes (Signed)
 New patient visit   Patient: Christina Neal   DOB: [birth year]   63 y.o. Female  MRN: [id number] Visit Date: 06/13/2024  Today's healthcare provider: LAURAINE LOISE BUOY, DO   Chief Complaint  Patient presents with   New Patient (Initial Visit)    Patient is present to establish care   Subjective    Christina Neal is a 63 y.o. female who presents today as a new patient to establish care.  HPI HPI     New Patient (Initial Visit)    Additional comments: Patient is present to establish care      Last edited by Lilian Fitzpatrick, CMA on 06/13/2024  2:08 PM.      Christina Neal is a 63 year old female who presents for establishing care and medication review after her previous doctor, Dr. Honor Neal, retired.  She is currently taking estradiol -norethindrone  tablets, intravaginal estradiol  and occasionally uses clobetasol  cream as needed. She takes Trelegy daily and uses naproxen infrequently, approximately every three to four months, for headaches.  She recalls having a blood test earlier in the year with normal results. She receives an annual flu shot. She is unsure about her last tetanus vaccine but believes it was a couple of years ago. She has not received the pneumonia or shingles vaccines, citing a friend's adverse experience with the shingles vaccine as a reason for avoidance.  She experienced shingles in the past, located around the lower back and buttocks area, and found relief through acupuncture and dietary changes advised by an acupuncturist.  She has a history of menopause with no subsequent vaginal bleeding. She has a history of colon polyps and recalls being told at her last visit that she did not need to return for another colonoscopy for ten years. She has not been screened for hepatitis C or HIV, citing a 'clean existence' as a reason for not pursuing testing.     Past Medical History:  Diagnosis Date   Asthma 2024   Minor allergies causing this   Colon polyp  2014   Past Surgical History:  Procedure Laterality Date   BRAIN SURGERY  2000   Chiari Malformation-sub occipital craniectomy   COLONOSCOPY WITH PROPOFOL  N/A 10/25/2019   Procedure: COLONOSCOPY WITH PROPOFOL ;  Surgeon: Tye Millet, DO;  Location: [facility];  Service: General;  Laterality: N/A;   CRANIOTOMY  11/08/1998   LAPAROSCOPY  11/08/1990   Family Status  Relation Name Status   Mother Neville Alive   Father Lynwood Searles   Cousin Paternal Alive   MGF Toribio Alive   MGM Delsey Alive   Sister Burnard Alive   Sister Randine Alive  No partnership data on file   Family History  Problem Relation Age of Onset   Arthritis Mother    Hearing loss Mother    COPD Father    Breast cancer Cousin 55   Depression Maternal Grandfather    Hearing loss Maternal Grandfather    Stroke Maternal Grandfather    Diabetes Maternal Grandmother    Hearing loss Maternal Grandmother    Varicose Veins Maternal Grandmother    Alcohol abuse Sister    Depression Sister    Drug abuse Sister    Early death Sister    Alcohol abuse Sister    Social History   Socioeconomic History   Marital status: Married    Spouse name: Not on file   Number of children: Not on file   Years of education: Not on file  Highest education level: Bachelor's degree (e.g., BA, AB, BS)  Occupational History   Not on file  Tobacco Use   Smoking status: Never   Smokeless tobacco: Never  Vaping Use   Vaping status: Never Used  Substance and Sexual Activity   Alcohol use: Yes    Alcohol/week: 8.0 standard drinks of alcohol    Types: 8 Glasses of wine per week   Drug use: No   Sexual activity: Yes    Birth control/protection: Post-menopausal  Other Topics Concern   Not on file  Social History Narrative   Not on file   Social Drivers of Health   Financial Resource Strain: Low Risk  (06/12/2024)   Overall Financial Resource Strain (CARDIA)    Difficulty of Paying Living Expenses: Not hard at all  Food  Insecurity: No Food Insecurity (06/12/2024)   Hunger Vital Sign    Worried About Running Out of Food in the Last Year: Never true    Ran Out of Food in the Last Year: Never true  Transportation Needs: No Transportation Needs (06/12/2024)   PRAPARE - Administrator, Civil Service (Medical): No    Lack of Transportation (Non-Medical): No  Physical Activity: Sufficiently Active (06/12/2024)   Exercise Vital Sign    Days of Exercise per Week: 7 days    Minutes of Exercise per Session: 70 min  Stress: No Stress Concern Present (06/12/2024)   [employer] of Occupational Health - Occupational Stress Questionnaire    Feeling of Stress: Not at all  Social Connections: Moderately Integrated (06/12/2024)   Social Connection and Isolation Panel    Frequency of Communication with Friends and Family: More than three times a week    Frequency of Social Gatherings with Friends and Family: More than three times a week    Attends Religious Services: More than 4 times per year    Active Member of [employer] or Organizations: No    Attends Engineer, structural: Not on file    Marital Status: Married   Outpatient Medications Prior to Visit  Medication Sig   clobetasol  cream (TEMOVATE ) 0.05 % Apply twice daily to affected body areas until smooth. Avoid applying to face, groin, and axilla   Estradiol  (IMVEXXY  MAINTENANCE PACK) 4 MCG INST Place 4 mcg vaginally 2 (two) times a week.   Estradiol -Norethindrone  Acet 0.5-0.1 MG tablet Take 1 tablet by mouth daily.   naproxen (NAPRELAN) 500 MG 24 hr tablet Take by mouth as needed.   TRELEGY ELLIPTA 200-62.5-25 MCG/ACT AEPB Inhale 1 puff into the lungs daily.   triamcinolone  lotion (KENALOG ) 0.1 % APPLY TWICE DAILY TO SCALP AND FOR UP TO2 WEEKS TO EARS AS DIRECTED. AVOID APPLYING TO FACE, GROIN, AND AXILLA. LONG TERM USE CAN CAUSE THINNING OF SKIN   No facility-administered medications prior to visit.   No Known Allergies  Immunization History   Administered Date(s) Administered   Influenza, Mdck, Trivalent,PF 6+ MOS(egg free) 08/29/2023   Influenza,inj,Quad PF,6+ Mos 09/21/2019, 08/05/2022   Influenza-Unspecified 08/21/2021   PFIZER(Purple Top)SARS-COV-2 Vaccination 01/24/2020, 02/18/2020    Health Maintenance  Topic Date Due   COVID-19 Vaccine (3 - 2024-25 season) 06/29/2024 (Originally 07/10/2023)   INFLUENZA VACCINE  02/05/2025 (Originally 06/08/2024)   DTaP/Tdap/Td (1 - Tdap) 06/13/2025 (Originally 05/20/1979)   Pneumococcal Vaccine: 50+ Years (1 of 2 - PCV) 06/13/2025 (Originally 05/20/1979)   Zoster Vaccines- Shingrix (1 of 2) 06/13/2025 (Originally 05/19/2010)   MAMMOGRAM  03/07/2026   Cervical Cancer Screening (HPV/Pap Cotest)  04/10/2028  Colonoscopy  10/24/2029   Hepatitis B Vaccines  Aged Out   HPV VACCINES  Aged Out   Meningococcal B Vaccine  Aged Out   Pneumococcal Vaccine: 19-49 Years  Discontinued   Hepatitis C Screening  Discontinued   HIV Screening  Discontinued    Patient Care Team: Shayda Kalka, Lauraine SAILOR, DO as PCP - General (Family Medicine) Dessa Reyes ORN, MD (General Surgery)  Review of Systems  Constitutional:  Negative for appetite change, chills, fatigue and fever.  HENT:  Negative for congestion, ear pain, rhinorrhea, sneezing and sore throat.   Eyes: Negative.  Negative for pain and redness.  Respiratory:  Negative for cough, chest tightness, shortness of breath and wheezing.   Cardiovascular:  Negative for chest pain, palpitations and leg swelling.  Gastrointestinal:  Negative for abdominal pain, blood in stool, constipation, diarrhea, nausea and vomiting.  Endocrine: Negative for polydipsia and polyphagia.  Genitourinary: Negative.  Negative for dysuria, flank pain, hematuria, pelvic pain, vaginal bleeding and vaginal discharge.  Musculoskeletal:  Negative for arthralgias, back pain, gait problem and joint swelling.  Skin:  Negative for rash.  Neurological: Negative.  Negative for dizziness,  tremors, seizures, weakness, light-headedness, numbness and headaches.  Hematological:  Negative for adenopathy.  Psychiatric/Behavioral: Negative.  Negative for behavioral problems, confusion and dysphoric mood. The patient is not nervous/anxious and is not hyperactive.         Objective    BP 117/82 (BP Location: Right Arm, Patient Position: Sitting, Cuff Size: Normal)   Pulse 68   Ht 5' 2.5 (1.588 m)   Wt 140 lb 1.6 oz (63.5 kg)   SpO2 98%   BMI 25.22 kg/m     Physical Exam Constitutional:      Appearance: Normal appearance.  HENT:     Head: Normocephalic and atraumatic.  Eyes:     General: No scleral icterus.    Extraocular Movements: Extraocular movements intact.     Conjunctiva/sclera: Conjunctivae normal.  Cardiovascular:     Rate and Rhythm: Normal rate and regular rhythm.     Pulses: Normal pulses.     Heart sounds: Normal heart sounds.  Pulmonary:     Effort: Pulmonary effort is normal. No respiratory distress.     Breath sounds: Normal breath sounds.  Musculoskeletal:     Right lower leg: No edema.     Left lower leg: No edema.  Skin:    General: Skin is warm and dry.  Neurological:     Mental Status: She is alert and oriented to person, place, and time. Mental status is at baseline.  Psychiatric:        Mood and Affect: Mood normal.        Behavior: Behavior normal.     Depression Screen    06/13/2024    2:40 PM 10/05/2021    8:39 AM 09/07/2019    4:15 PM  PHQ 2/9 Scores  PHQ - 2 Score 0 0 0  PHQ- 9 Score  0 0   No results found for any visits on 06/13/24.  Assessment & Plan     Asthma, extrinsic, moderate persistent, uncomplicated  History of colon polyps  Decreased glomerular filtration rate (GFR)      Asthma Controlled with Trelegy. No exacerbations or symptoms. Follows with pulmonology; defer to specialist management.  Establishing care with a new doctor, encounter for No acute concerns. Current medications include  estradiol -norethindrone , intravaginal estradiol , Trelegy, clobetasol  cream, and naproxen. Discussed vaccinations and screenings. Annual flu shot received. COVID booster,  pneumonia, and shingles vaccines not received. Declined pneumonia vaccine. Uncertain about tetanus vaccine status. History of one colon polyp; next screening in ten years. - Request records to confirm tetanus vaccination status. - Continue annual flu vaccinations. - Consider pneumonia and shingles vaccines. - Schedule colonoscopy in ten years.  Decreased glomerular filtration rate (possible Chronic Kidney Disease Stage 2) EGFR slightly low, indicating possible CKD stage 2. Diagnosis requires two levels at least three months apart. - Monitor eGFR levels. - Consider urine test for proteinuria if eGFR remains low.     Return in about 6 months (around 12/14/2024) for CPE.     I discussed the assessment and treatment plan with the patient  The patient was provided an opportunity to ask questions and all were answered. The patient agreed with the plan and demonstrated an understanding of the instructions.   The patient was advised to call back or seek an in-person evaluation if the symptoms worsen or if the condition fails to improve as anticipated.    LAURAINE LOISE BUOY, DO  [facility] [phone] (phone) [phone] (fax)  [facility]

## 2024-07-31 DIAGNOSIS — H1013 Acute atopic conjunctivitis, bilateral: Secondary | ICD-10-CM | POA: Diagnosis not present

## 2024-08-16 ENCOUNTER — Telehealth: Payer: Self-pay

## 2024-08-16 NOTE — Telephone Encounter (Signed)
 Refill not appropriate. Bernarda Schroeder, PA sent one year Rx on 04/30/24.

## 2024-11-14 ENCOUNTER — Ambulatory Visit (INDEPENDENT_AMBULATORY_CARE_PROVIDER_SITE_OTHER): Admitting: Family Medicine

## 2024-11-14 ENCOUNTER — Encounter: Payer: Self-pay | Admitting: Family Medicine

## 2024-11-14 VITALS — BP 120/76 | HR 64 | Temp 97.9°F | Ht 62.5 in | Wt 139.3 lb

## 2024-11-14 DIAGNOSIS — G629 Polyneuropathy, unspecified: Secondary | ICD-10-CM

## 2024-11-14 DIAGNOSIS — M79645 Pain in left finger(s): Secondary | ICD-10-CM

## 2024-11-14 DIAGNOSIS — R030 Elevated blood-pressure reading, without diagnosis of hypertension: Secondary | ICD-10-CM

## 2024-11-14 DIAGNOSIS — H9313 Tinnitus, bilateral: Secondary | ICD-10-CM

## 2024-11-14 DIAGNOSIS — M79644 Pain in right finger(s): Secondary | ICD-10-CM | POA: Diagnosis not present

## 2024-11-14 MED ORDER — GABAPENTIN 100 MG PO CAPS: 100.0000 mg | ORAL_CAPSULE | Freq: Every day | ORAL | 0 refills | Status: AC

## 2024-11-14 NOTE — Patient Instructions (Signed)
 To keep you healthy, please keep in mind the following health maintenance items that you are due for:   Health Maintenance Due  Topic Date Due   COVID-19 Vaccine (3 - 2025-26 season) 07/09/2024     Best Wishes,   Dr. Lang

## 2024-11-14 NOTE — Progress Notes (Signed)
 "  Acute Office Visit  Patient ID: Christina Neal, female    DOB: 1961/08/26, 64 y.o.   MRN: [id number]  PCP: Christina Lauraine SAILOR, DO  Chief Complaint  Patient presents with   Peripheral Neuropathy    Onset x one week, notes in feet and hands. Problems with grip, pain in both thumbs with gripping. Denies color changes in hands or feet      Subjective:     HPI  Discussed the use of AI scribe software for clinical note transcription with the patient, who gave verbal consent to proceed.  History of Present Illness Christina Neal is a 64 year old female who presents with acute onset neuropathy.  She describes an acute onset of neuropathy with new numbness and problems with grip in both thumbs, without any color changes in her hands. The symptoms began last week on Sunday with soreness in both hands and thumbs, making it difficult to grasp objects. She describes the sensation as 'a little bit tingling, a little bit of numbness.'  She has a history of neuropathy and nerve issues in her neck, which previously caused numbness in one hand or the other, but this time both hands are affected. No current neck pain. The numbness is primarily in her hands, with a sensation of tightness and soreness in her thumbs. She has taken anti-inflammatory medications like Aleve and Vicoprofen, which provide some relief. The symptoms are not constant and tend to come and go.  Additionally, she experiences ringing in her ears, lower back pain, and hip pain. She mentions a history of sound exposure, which could contribute to the tinnitus.  Her blood pressure was noted to be elevated at 141/92 during the visit, although she typically has good blood pressure and is not on any antihypertensive medications.  Her current medications include estradiol , norethindrone , vaginal estrogen, Trelegy, Kenalog , clobetasol  creams, and naproxen. She sleeps well and does not experience symptoms at night.  No results found.  ROS      Objective:    BP 120/76 (Cuff Size: Normal)   Pulse 64   Temp 97.9 F (36.6 C) (Oral)   Ht 5' 2.5 (1.588 m)   Wt 139 lb 4.8 oz (63.2 kg)   SpO2 100%   BMI 25.07 kg/m  BP Readings from Last 3 Encounters:  11/14/24 120/76  06/13/24 117/82  04/27/24 125/75   Wt Readings from Last 3 Encounters:  11/14/24 139 lb 4.8 oz (63.2 kg)  06/13/24 140 lb 1.6 oz (63.5 kg)  04/27/24 138 lb (62.6 kg)      Physical Exam  Physical Exam VITALS: BP- 141/92 GENERAL: Well appearing MUSCULOSKELETAL: Interosseous muscle strength 5/5 bilaterally. Group strength 5/5 bilaterally. Normal range of motion of hands and wrists. No pain with movement of hands. No tenderness along ulnar aspect of bilateral forearms. No joint swelling or erythema. SKIN: Normal skin appearance.    No results found for any visits on 11/14/24.     Assessment & Plan:   Problem List Items Addressed This Visit   None Visit Diagnoses       Neuropathy    -  Primary   Relevant Medications   gabapentin  (NEURONTIN ) 100 MG capsule   Other Relevant Orders   CMP14+EGFR   Hemoglobin A1c   CBC   Vitamin B12   Folate   TSH + free T4     Elevated blood pressure reading       Relevant Orders   CMP14+EGFR   TSH +  free T4     Bilateral thumb pain       Relevant Orders   Vitamin B12   Folate     Tinnitus aurium, bilateral            Assessment & Plan Acute polyneuropathy Acute onset of bilateral hand neuropathy with tingling and numbness, affecting grip strength. Differential diagnosis includes cervical radiculopathy, vitamin deficiencies, and diabetes. No carpal tunnel syndrome or autoimmune etiology. Symptoms are intermittent and not associated with neck pain. Gabapentin  discussed for symptom relief, but she prefers to avoid it unless symptoms worsen. - Ordered CMP, CBC, A1c, vitamin B12, folate, B6, and thyroid panel (TSH and T4) - Will consider gabapentin  100 mg at bedtime if symptoms worsen - Follow up with  orthopedics for back and hip issues  Elevated blood pressure Blood pressure elevated at 141/92, but rechecked at 120/76. No current antihypertensive medications. No immediate concerns for hypertension-related complications. - Continue to monitor blood pressure - no new medications added today    Bilateral tinnitus Intermittent bilateral tinnitus, possibly related to past upper respiratory infection or sound exposure. No pulsatile tinnitus or clear etiology identified. Blood pressure unlikely to be the cause. - Continue to monitor symptoms - pt to schedule f/u with PCP     Meds ordered this encounter  Medications   gabapentin  (NEURONTIN ) 100 MG capsule    Sig: Take 1 capsule (100 mg total) by mouth at bedtime.    Dispense:  30 capsule    Refill:  0    No follow-ups on file.  Rockie Agent, MD [facility]   "

## 2024-11-15 LAB — CBC
Hematocrit: 42.2 % (ref 34.0–46.6)
Hemoglobin: 13.9 g/dL (ref 11.1–15.9)
MCH: 30.9 pg (ref 26.6–33.0)
MCHC: 32.9 g/dL (ref 31.5–35.7)
MCV: 94 fL (ref 79–97)
Platelets: 188 x10E3/uL (ref 150–450)
RBC: 4.5 x10E6/uL (ref 3.77–5.28)
RDW: 12.3 % (ref 11.7–15.4)
WBC: 5.6 x10E3/uL (ref 3.4–10.8)

## 2024-11-15 LAB — HEMOGLOBIN A1C
Est. average glucose Bld gHb Est-mCnc: 94 mg/dL
Hgb A1c MFr Bld: 4.9 % (ref 4.8–5.6)

## 2024-11-15 LAB — TSH+FREE T4
Free T4: 1.17 ng/dL (ref 0.82–1.77)
TSH: 1.84 u[IU]/mL (ref 0.450–4.500)

## 2024-11-15 LAB — CMP14+EGFR
ALT: 17 IU/L (ref 0–32)
AST: 16 IU/L (ref 0–40)
Albumin: 4.7 g/dL (ref 3.9–4.9)
Alkaline Phosphatase: 37 IU/L — ABNORMAL LOW (ref 49–135)
BUN/Creatinine Ratio: 20 (ref 12–28)
BUN: 13 mg/dL (ref 8–27)
Bilirubin Total: 0.7 mg/dL (ref 0.0–1.2)
CO2: 24 mmol/L (ref 20–29)
Calcium: 9.2 mg/dL (ref 8.7–10.3)
Chloride: 102 mmol/L (ref 96–106)
Creatinine, Ser: 0.64 mg/dL (ref 0.57–1.00)
Globulin, Total: 2.2 g/dL (ref 1.5–4.5)
Glucose: 94 mg/dL (ref 70–99)
Potassium: 4.1 mmol/L (ref 3.5–5.2)
Sodium: 141 mmol/L (ref 134–144)
Total Protein: 6.9 g/dL (ref 6.0–8.5)
eGFR: 99 mL/min/1.73

## 2024-11-15 LAB — VITAMIN B12: Vitamin B-12: 886 pg/mL (ref 232–1245)

## 2024-11-15 LAB — FOLATE: Folate: 8.5 ng/mL

## 2024-11-16 ENCOUNTER — Ambulatory Visit: Payer: Self-pay | Admitting: Family Medicine

## 2024-12-26 ENCOUNTER — Ambulatory Visit: Admitting: Family Medicine
